# Patient Record
Sex: Female | Born: 1960 | Race: White | Hispanic: No | Smoking: Never smoker
Health system: Southern US, Community
[De-identification: ages and names within clinical notes are randomized; demographics above are authoritative.]

## PROBLEM LIST (undated history)

## (undated) DIAGNOSIS — M549 Dorsalgia, unspecified: Secondary | ICD-10-CM

## (undated) DIAGNOSIS — F1910 Other psychoactive substance abuse, uncomplicated: Secondary | ICD-10-CM

## (undated) DIAGNOSIS — M1990 Unspecified osteoarthritis, unspecified site: Secondary | ICD-10-CM

## (undated) DIAGNOSIS — F1011 Alcohol abuse, in remission: Secondary | ICD-10-CM

## (undated) DIAGNOSIS — K769 Liver disease, unspecified: Secondary | ICD-10-CM

## (undated) DIAGNOSIS — B1920 Unspecified viral hepatitis C without hepatic coma: Secondary | ICD-10-CM

## (undated) HISTORY — PX: ECTOPIC PREGNANCY SURGERY: SHX613

---

## 1998-06-13 ENCOUNTER — Other Ambulatory Visit: Admission: RE | Admit: 1998-06-13 | Discharge: 1998-06-13 | Payer: Self-pay | Admitting: Obstetrics & Gynecology

## 2005-12-04 ENCOUNTER — Ambulatory Visit (HOSPITAL_COMMUNITY): Admission: RE | Admit: 2005-12-04 | Discharge: 2005-12-04 | Payer: Self-pay | Admitting: Obstetrics & Gynecology

## 2009-11-07 ENCOUNTER — Ambulatory Visit: Payer: Self-pay | Admitting: Gastroenterology

## 2009-12-26 ENCOUNTER — Ambulatory Visit: Payer: Self-pay | Admitting: Gastroenterology

## 2010-04-07 ENCOUNTER — Other Ambulatory Visit: Payer: Self-pay | Admitting: Gastroenterology

## 2010-04-07 DIAGNOSIS — B182 Chronic viral hepatitis C: Secondary | ICD-10-CM

## 2010-04-18 ENCOUNTER — Other Ambulatory Visit (HOSPITAL_COMMUNITY): Payer: Self-pay

## 2010-04-23 ENCOUNTER — Ambulatory Visit (HOSPITAL_COMMUNITY)
Admission: RE | Admit: 2010-04-23 | Discharge: 2010-04-23 | Disposition: A | Discharge status: Home / Self Care | Payer: PRIVATE HEALTH INSURANCE | Source: Ambulatory Visit | Attending: Gastroenterology | Admitting: Gastroenterology

## 2010-04-23 ENCOUNTER — Other Ambulatory Visit: Payer: Self-pay | Admitting: Diagnostic Radiology

## 2010-04-23 DIAGNOSIS — B182 Chronic viral hepatitis C: Secondary | ICD-10-CM

## 2010-04-23 DIAGNOSIS — K7689 Other specified diseases of liver: Secondary | ICD-10-CM | POA: Insufficient documentation

## 2010-04-23 LAB — CBC
HCT: 41.2 % (ref 36.0–46.0)
MCHC: 34.2 g/dL (ref 30.0–36.0)
MCV: 87.1 fL (ref 78.0–100.0)
RDW: 12.3 % (ref 11.5–15.5)

## 2010-04-23 LAB — APTT: aPTT: 32 seconds (ref 24–37)

## 2010-05-08 ENCOUNTER — Ambulatory Visit (INDEPENDENT_AMBULATORY_CARE_PROVIDER_SITE_OTHER): Payer: PRIVATE HEALTH INSURANCE | Admitting: Gastroenterology

## 2010-05-08 DIAGNOSIS — B182 Chronic viral hepatitis C: Secondary | ICD-10-CM

## 2010-06-26 ENCOUNTER — Other Ambulatory Visit: Payer: PRIVATE HEALTH INSURANCE | Admitting: Gastroenterology

## 2011-07-28 IMAGING — US US BIOPSY
1 series · 9 of 9 positions shown · non-contrast
Comparison: none

Clinical: 49-year female with hepatitis C.

ULTRASOUND GUIDED CORE LIVER BIOPSY
Sedation:  1.5 mg IV Versed; 62.5 mcg IV Fentanyl
Total Moderate Sedation Time:  16 minutes.
An ultrasound guided liver biopsy was thoroughly discussed with the
patient and questions were answered.  The benefits, risks,
alternatives, and complications were also discussed.  The patient
understands and wishes to proceed with the procedure.  Written
consent was obtained.
Ultrasound of the liver was performed and an appropriate skin entry
site was determined.  Skin site was marked, prepped with Betadine,
and draped in the usual sterile fashion.  Local anesthesia was
provided with 1% Lidocaine.
A 17 gauge trocar needle was advanced under ultrasound guidance
into the liver.  A total of three coaxial 18 gauge core samples
were then obtained through the guide needle. The guide needle was
removed. Post procedure scans were obtained.
Complications:  None

[Series 1: us biopsy · 0.24mm/px · 9 of 9 slices shown]
[im 1/9]
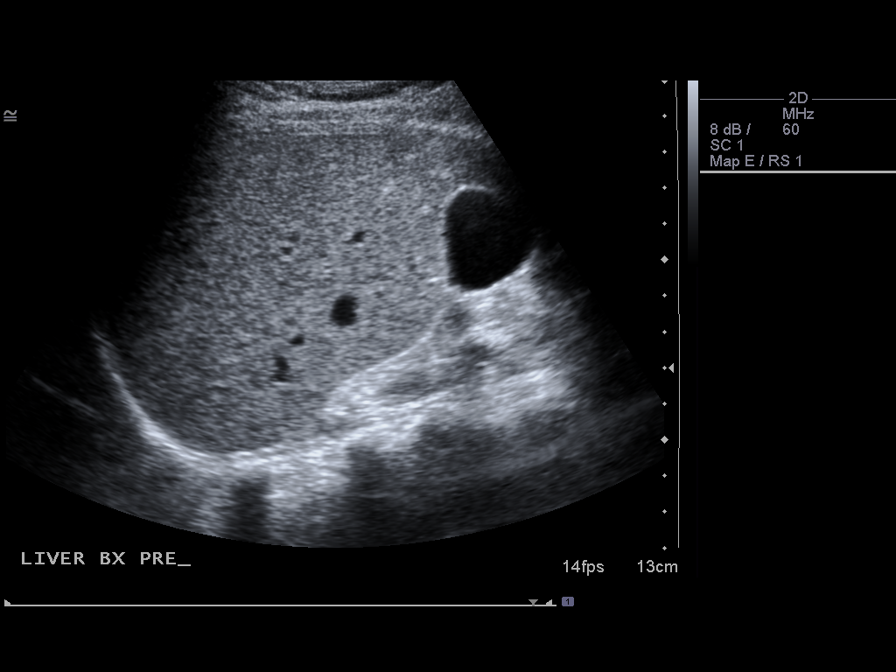
[im 2/9]
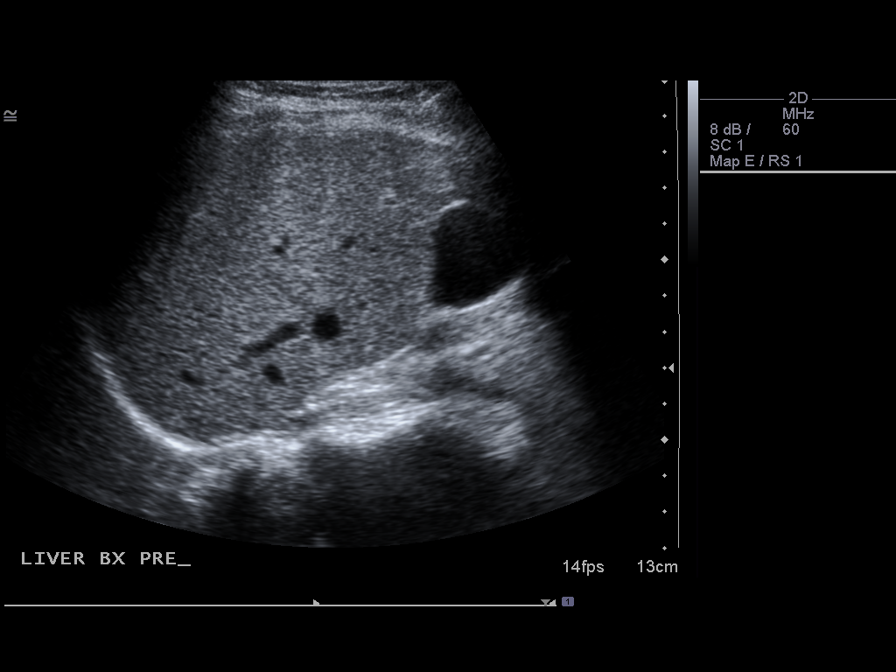
[im 3/9]
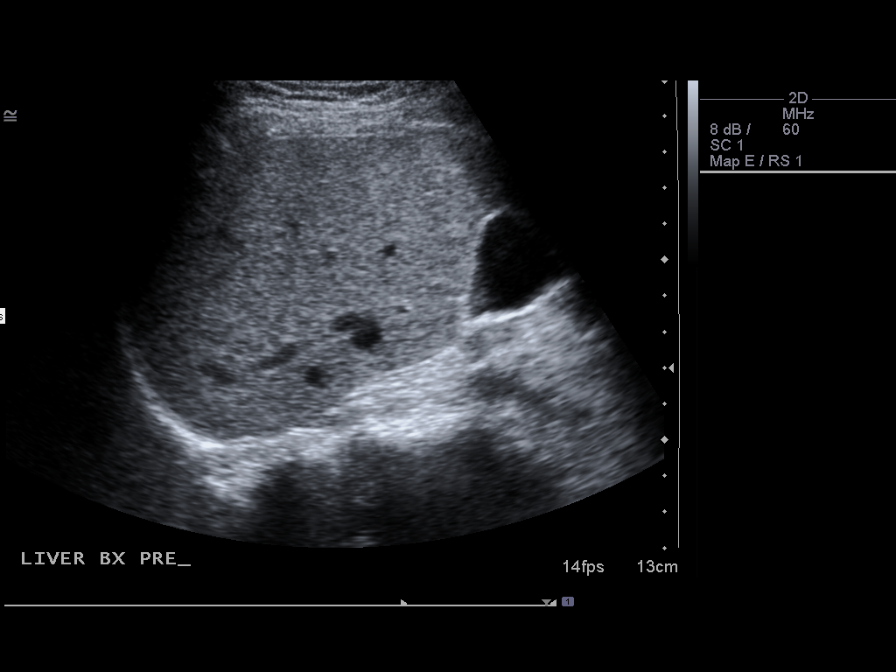
[im 4/9]
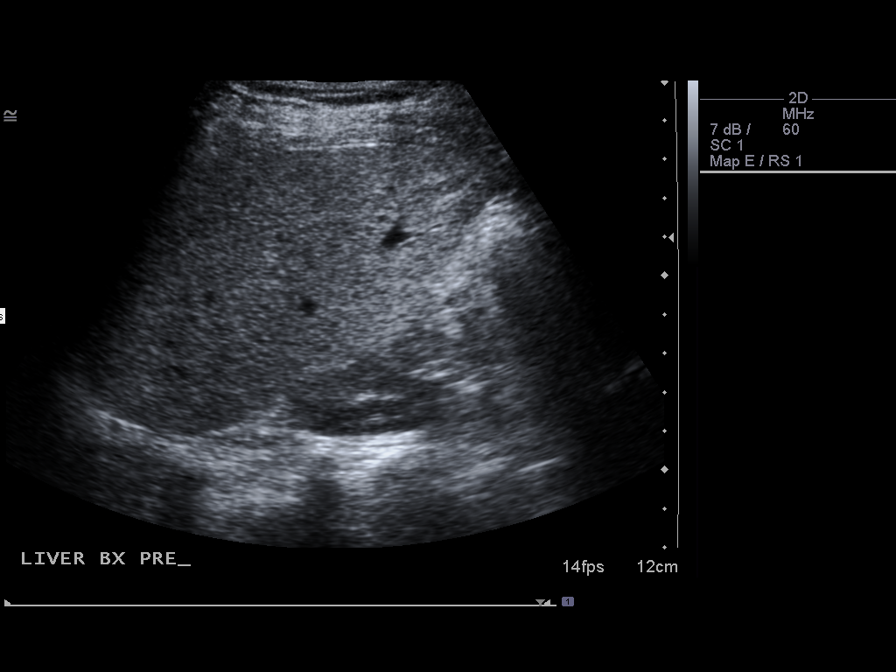
[im 5/9]
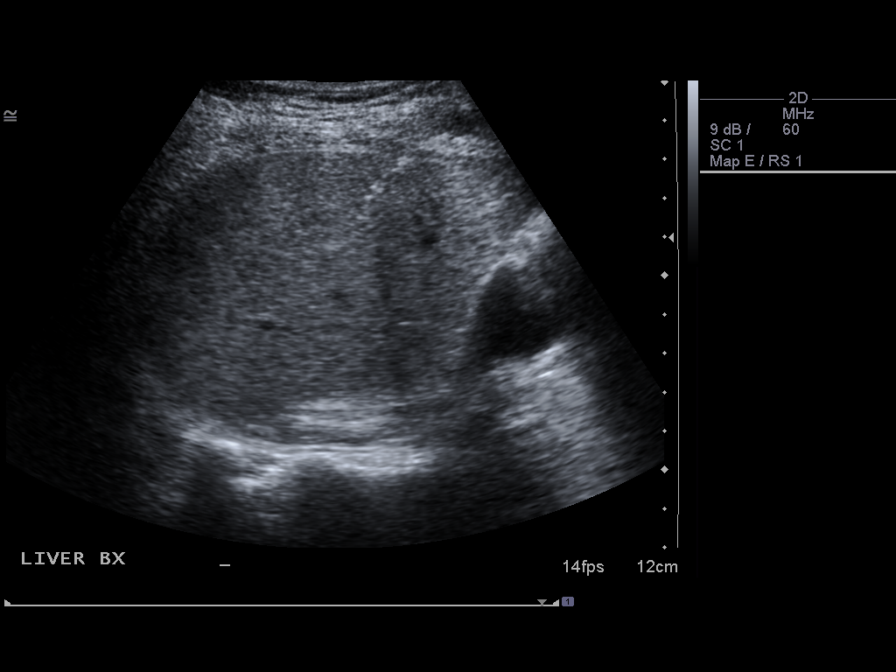
[im 6/9]
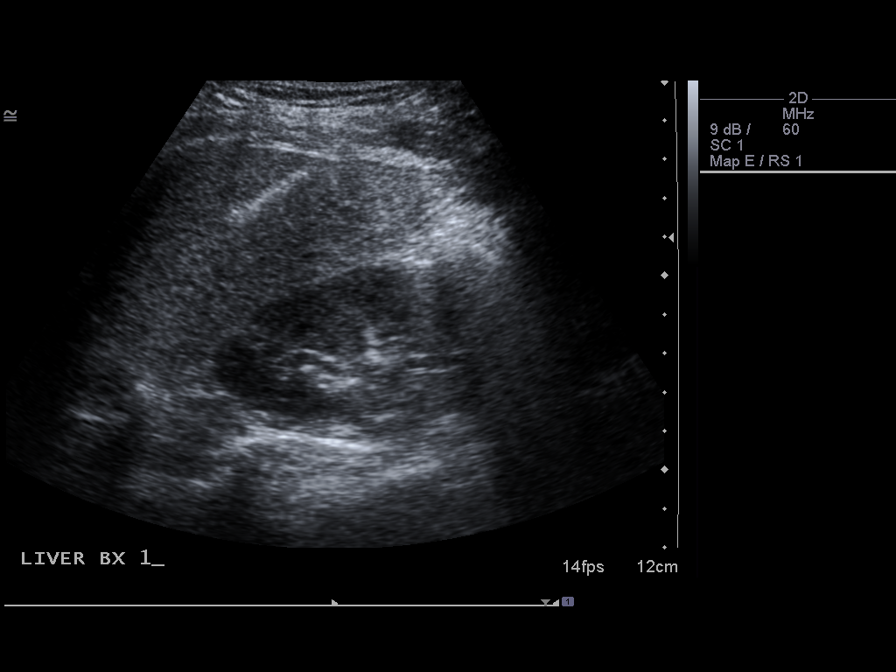
[im 7/9]
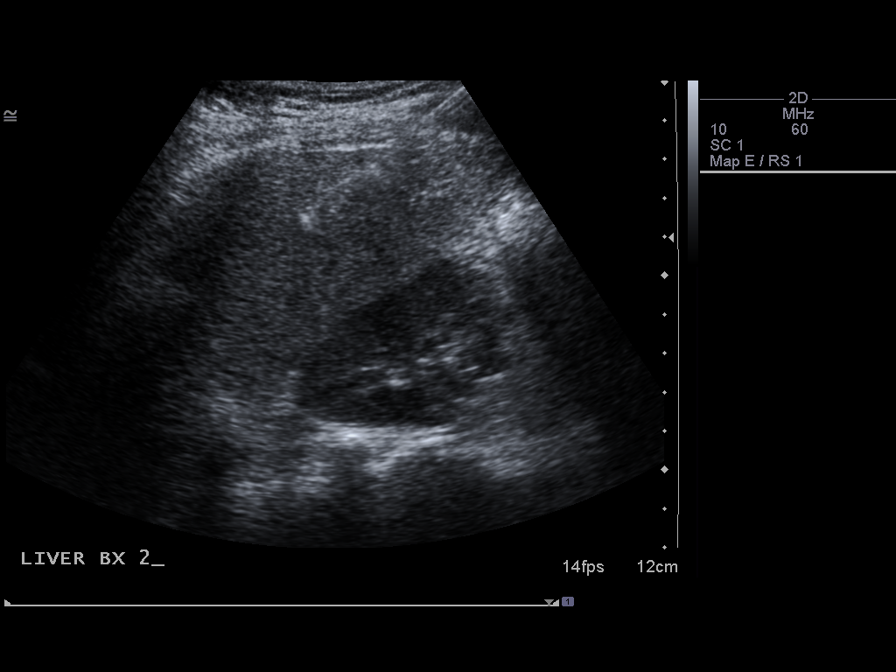
[im 8/9]
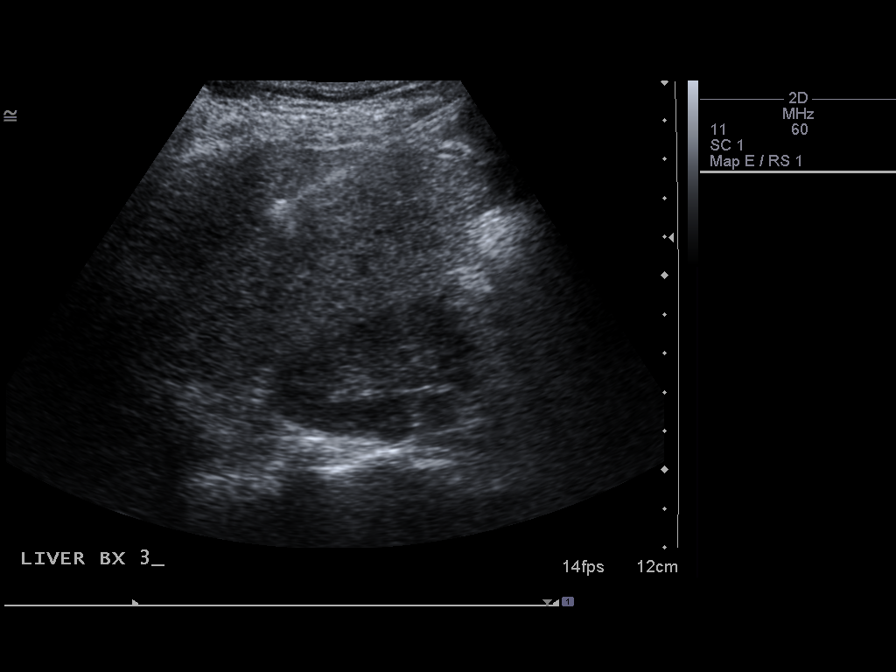
[im 9/9]
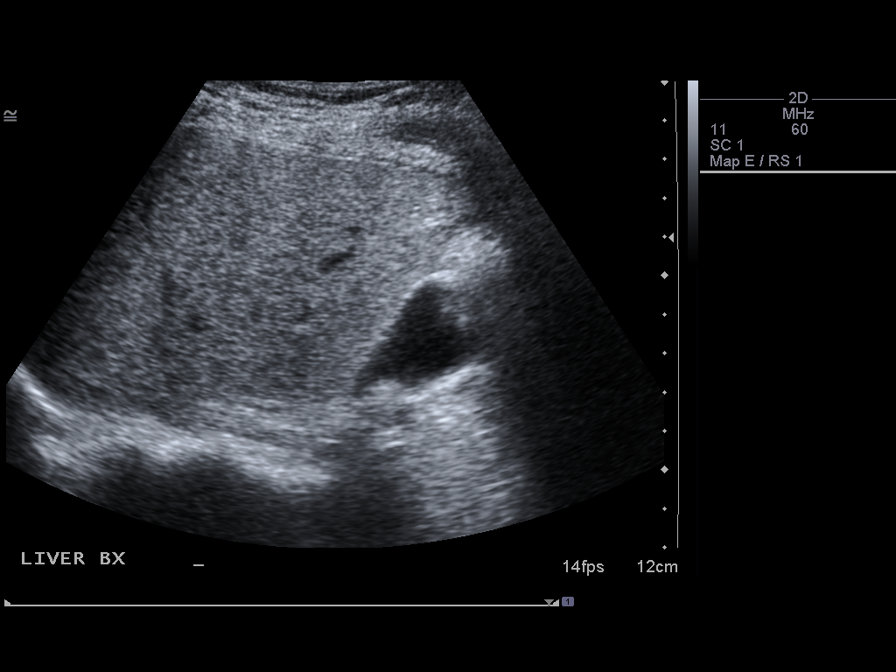

[9 of 9 positions shown; findings below may reference images not displayed]

FINDINGS: Core biopsies obtained from the right hepatic lobe.
IMPRESSION: Successful ultrasound guided random core biopsy of the liver.

## 2015-02-12 ENCOUNTER — Other Ambulatory Visit (HOSPITAL_COMMUNITY): Payer: Self-pay | Admitting: Gastroenterology

## 2015-02-12 DIAGNOSIS — B1920 Unspecified viral hepatitis C without hepatic coma: Secondary | ICD-10-CM

## 2015-02-28 ENCOUNTER — Ambulatory Visit (HOSPITAL_COMMUNITY)
Admission: RE | Admit: 2015-02-28 | Discharge: 2015-02-28 | Disposition: A | Discharge status: Home / Self Care | Payer: PRIVATE HEALTH INSURANCE | Source: Ambulatory Visit | Attending: Gastroenterology | Admitting: Gastroenterology

## 2015-02-28 DIAGNOSIS — B1920 Unspecified viral hepatitis C without hepatic coma: Secondary | ICD-10-CM

## 2016-02-03 IMAGING — US US ABDOMEN COMPLETE W/ ELASTOGRAPHY
2 series · 13 of 25 positions shown · non-contrast
Comparison: None.

CLINICAL DATA: Hepatitis C



[Series 1: us abdomen complete w/ elastography · 0.20mm/px · 2 of 13 slices shown (1 of 2)]
[im 1/13]
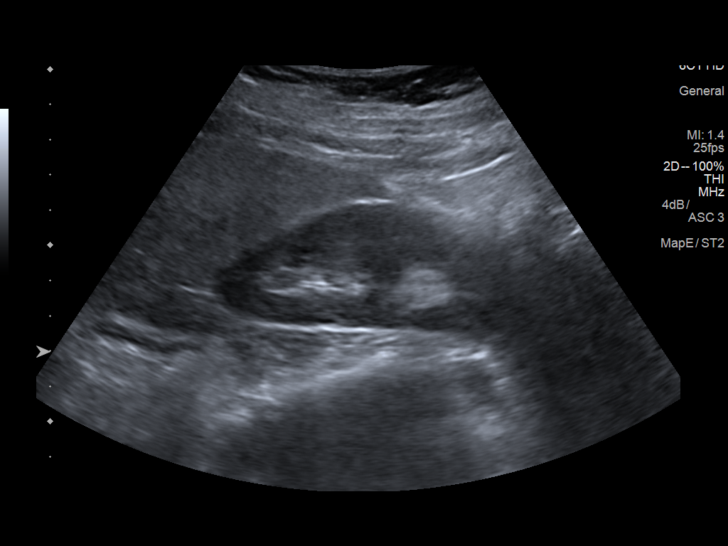
[im 9/13]
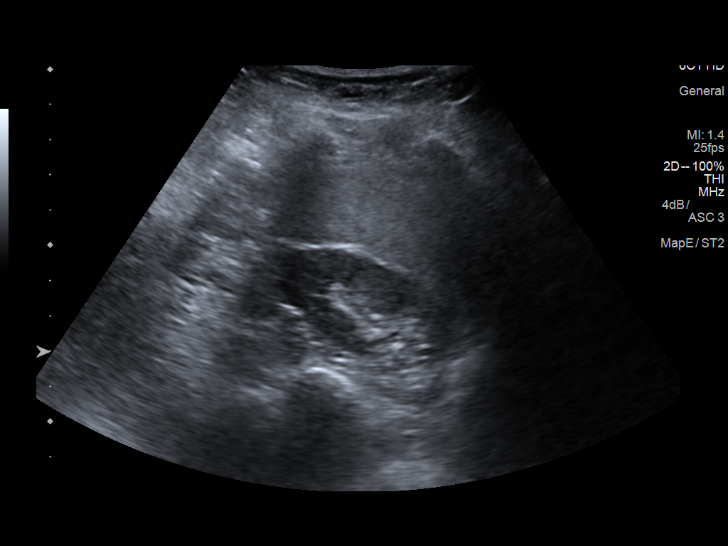

[Series 1: us abdomen complete w/ elastography · 0.22mm/px · 11 of 75 slices shown (2 of 2)]
[im 1/75]
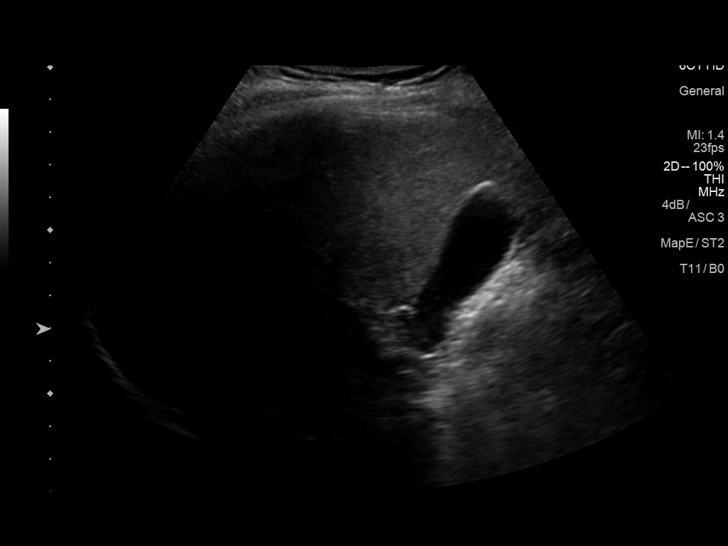
[im 8/75]
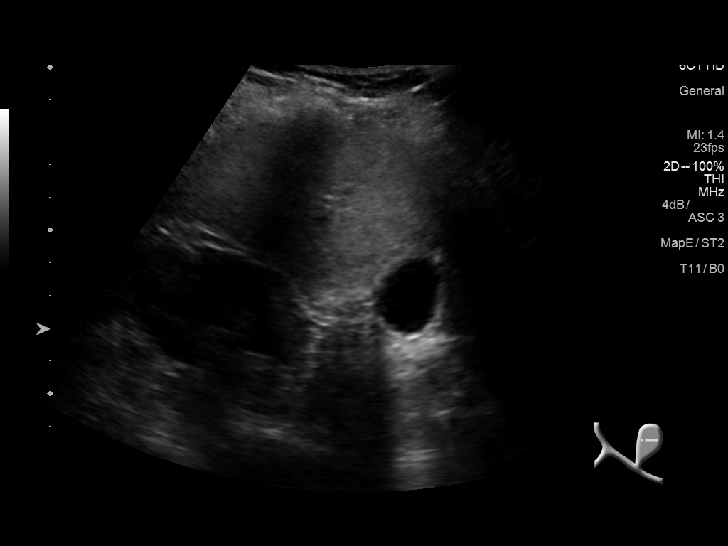
[im 15/75]
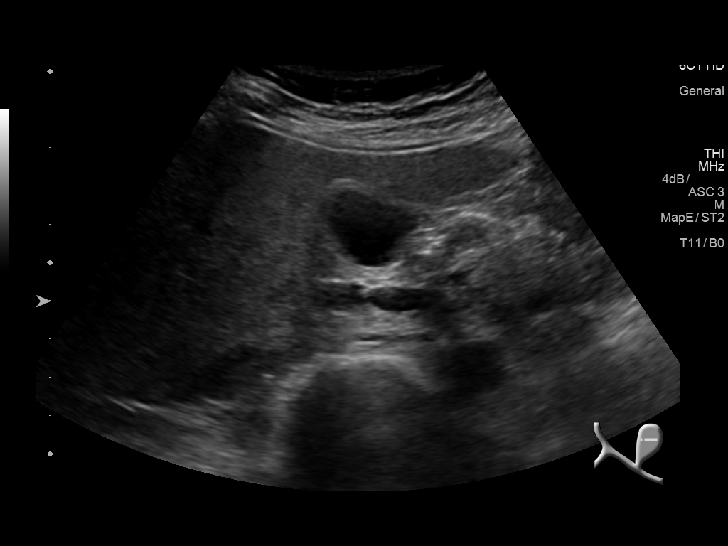
[im 23/75]
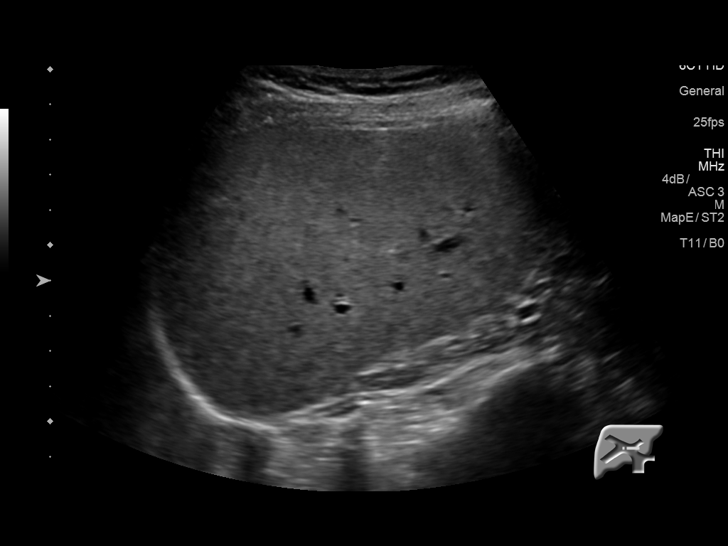
[im 30/75]
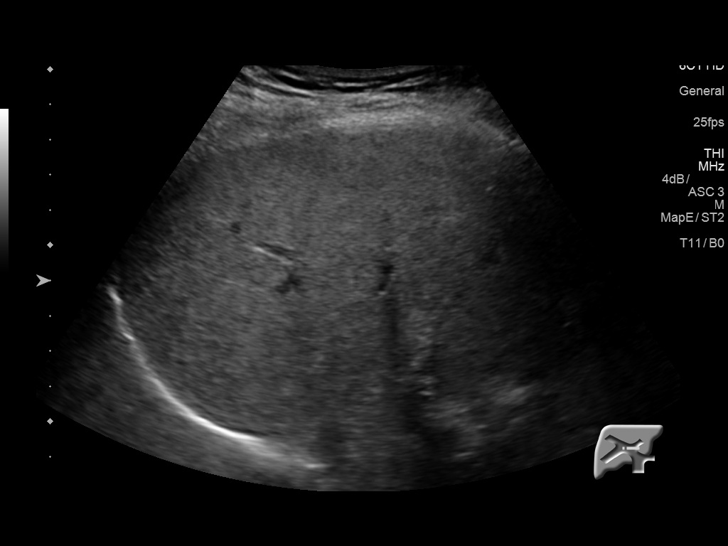
[im 38/75]
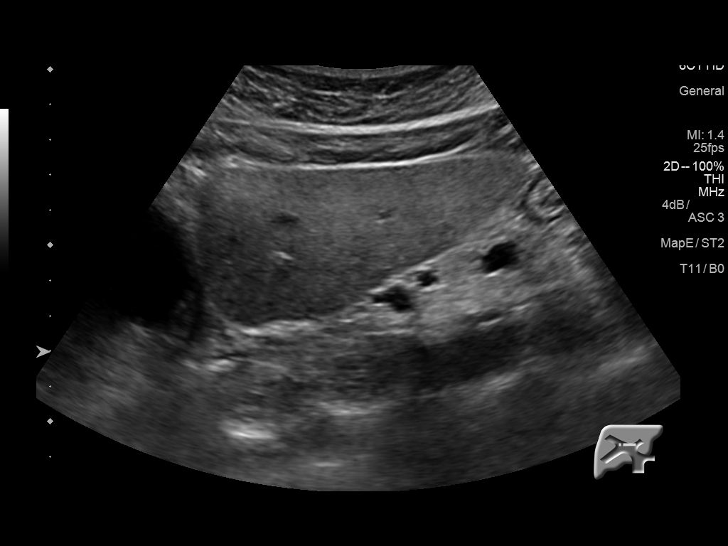
[im 45/75]
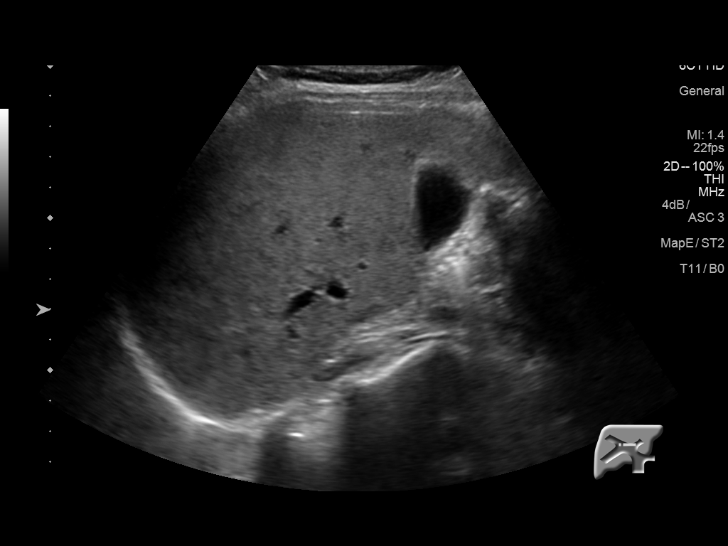
[im 52/75]
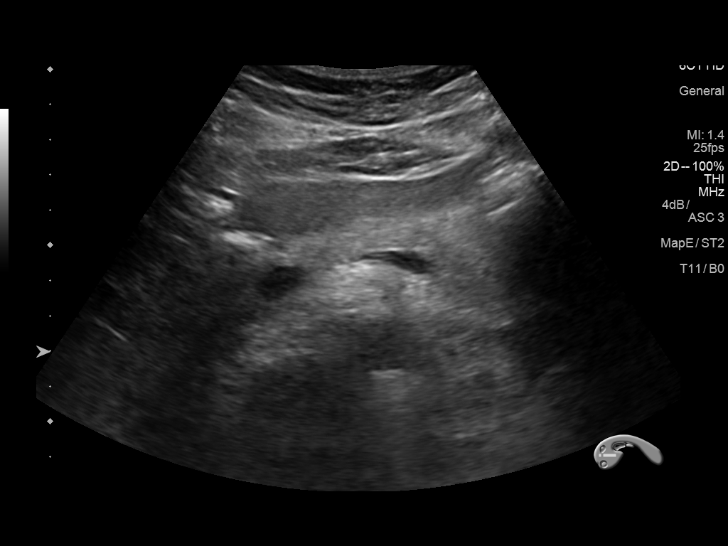
[im 60/75]
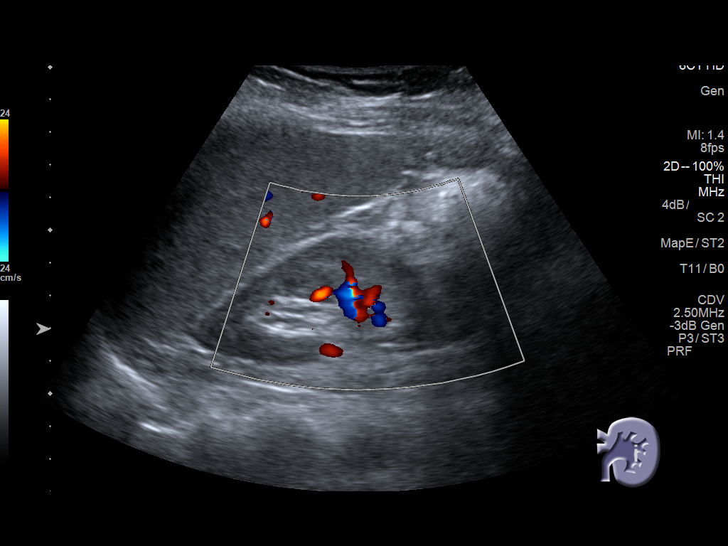
[im 67/75]
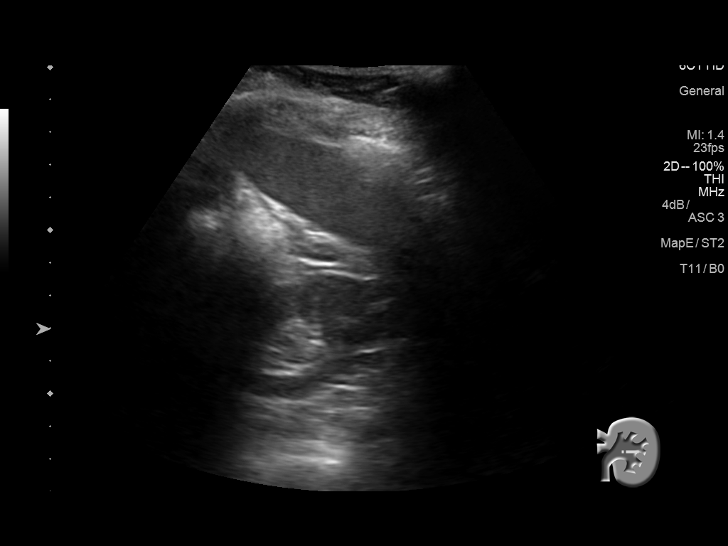
[im 75/75]
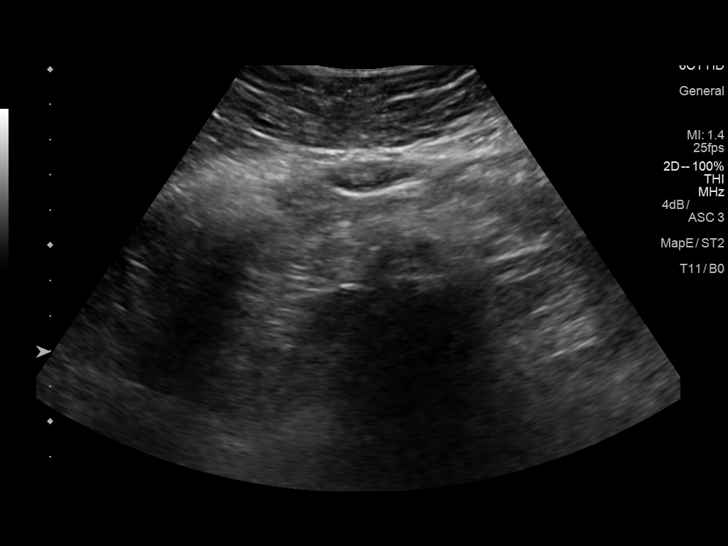

[13 of 25 positions shown; findings below may reference images not displayed]

FINDINGS: ULTRASOUND ABDOMEN

Gallbladder: No gallstones, gallbladder wall thickening, or
pericholecystic fluid. Negative sonographic Murphy's sign.

Common bile duct: Diameter: 2 mm is

Liver: No focal lesion identified. Within normal limits in
parenchymal echogenicity.

IVC: No abnormality visualized.

Pancreas: Visualized portion unremarkable.

Spleen: Size and appearance within normal limits.

Right Kidney: Length: 9.1 cm. No mass or hydronephrosis.

Left Kidney: Length: 9.4 cm. No mass or hydronephrosis.

Abdominal aorta: No aneurysm visualized.

Other findings: None.

ULTRASOUND HEPATIC ELASTOGRAPHY

Device: Siemens Helix VTQ

Patient position: Left lateral decubitus

Transducer 6C1

Number of measurements:  10

Hepatic Segment:  8

Median velocity:   1.24  m/sec

IQR:

IQR/Median velocity ratio

Corresponding Metavir fibrosis score:  F2/F3

Risk of fibrosis: Moderate

Limitations of exam: None

Pertinent findings noted on other imaging exams:  None

Please note that abnormal shear wave velocities may also be
identified in clinical settings other than with hepatic fibrosis,
such as: acute hepatitis, elevated right heart and central venous
pressures including use of beta blockers, Jorbenadze disease
(Rennemo), infiltrative processes such as
mastocytosis/amyloidosis/infiltrative tumor, extrahepatic
cholestasis, in the post-prandial state, and liver transplantation.
Correlation with patient history, laboratory data, and clinical
condition recommended.
IMPRESSION: Unremarkable abdominal ultrasound.

Median hepatic shear wave velocity is calculated at 1.24 m/sec.

Corresponding Metavir fibrosis score is F2/F3.

Risk of fibrosis is moderate.

Follow-up:  Additional testing appropriate.

## 2016-02-03 IMAGING — US US ABDOMEN COMPLETE W/ ELASTOGRAPHY
1 series · 12 of 12 positions shown · non-contrast
Comparison: None.

CLINICAL DATA: Hepatitis C



[Series 1: us abdomen complete w/ elastography · 0.15mm/px · 12 of 12 slices shown]
[im 1/12]
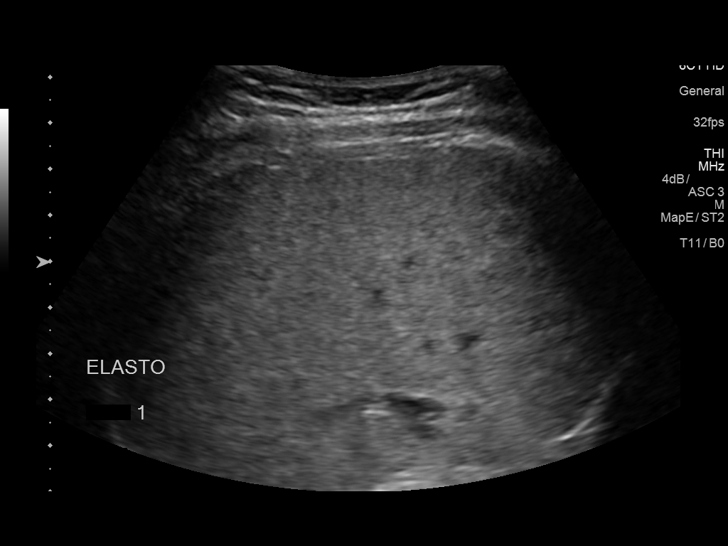
[im 2/12]
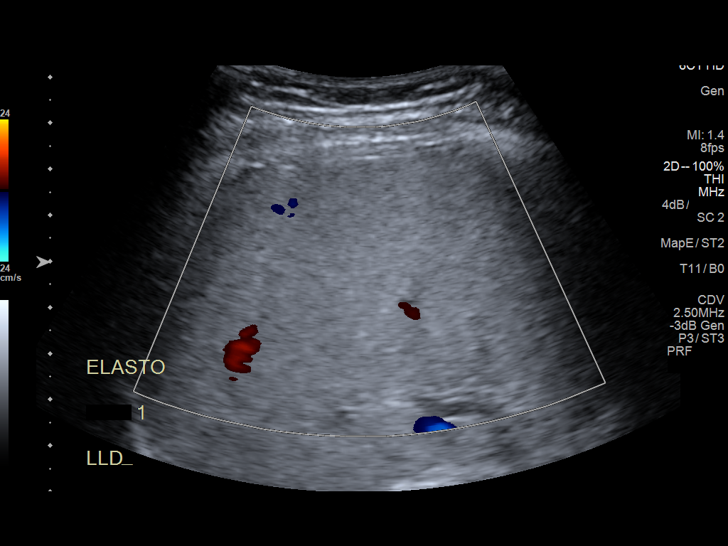
[im 3/12]
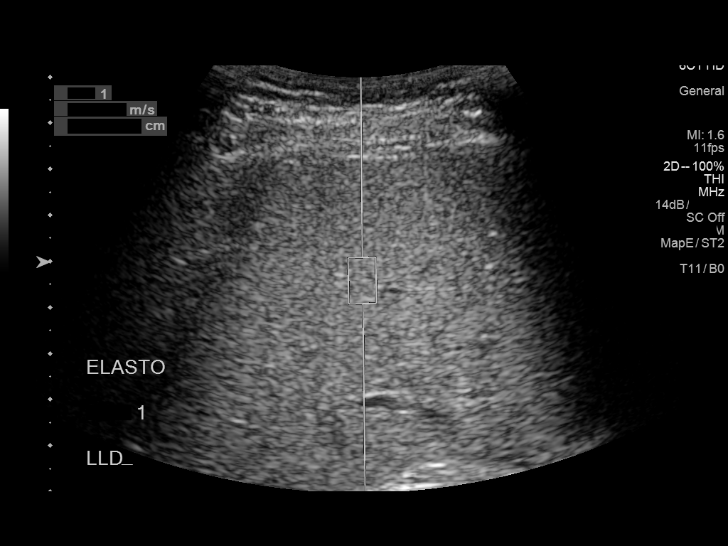
[im 4/12]
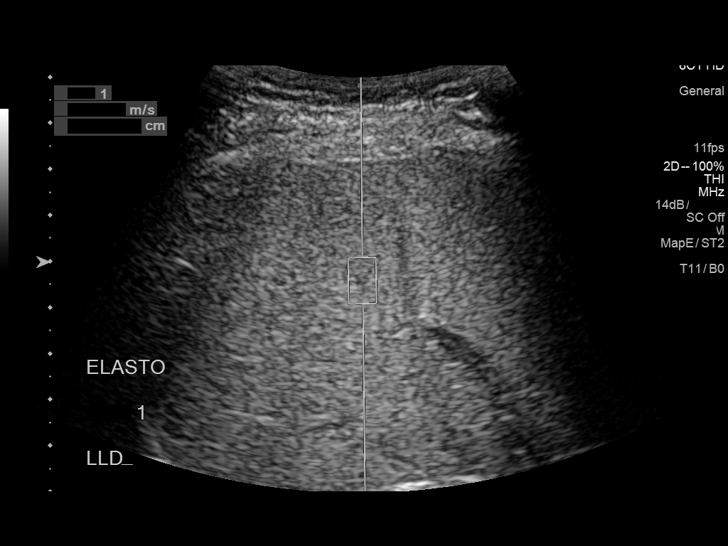
[im 5/12]
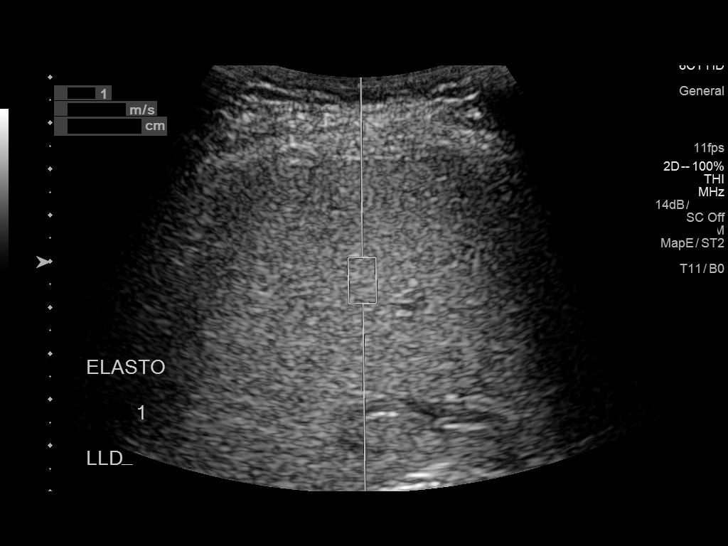
[im 6/12]
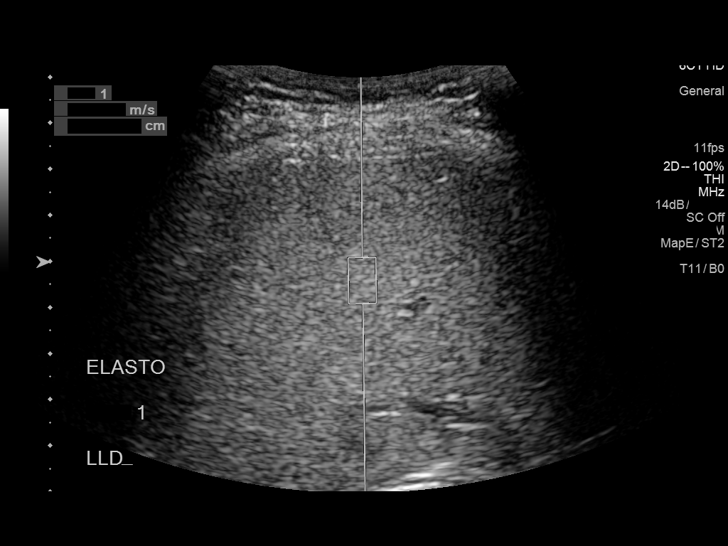
[im 7/12]
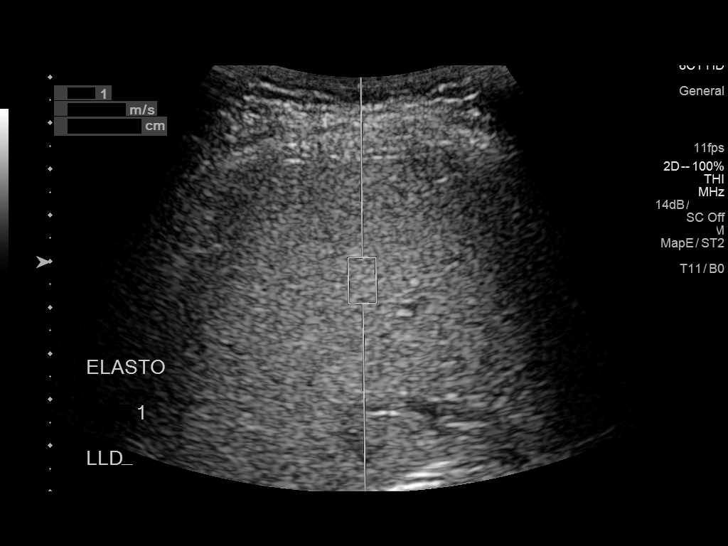
[im 8/12]
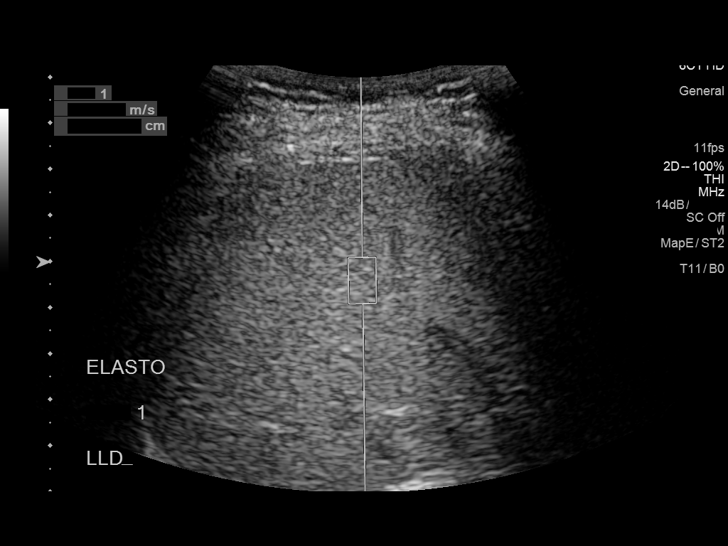
[im 9/12]
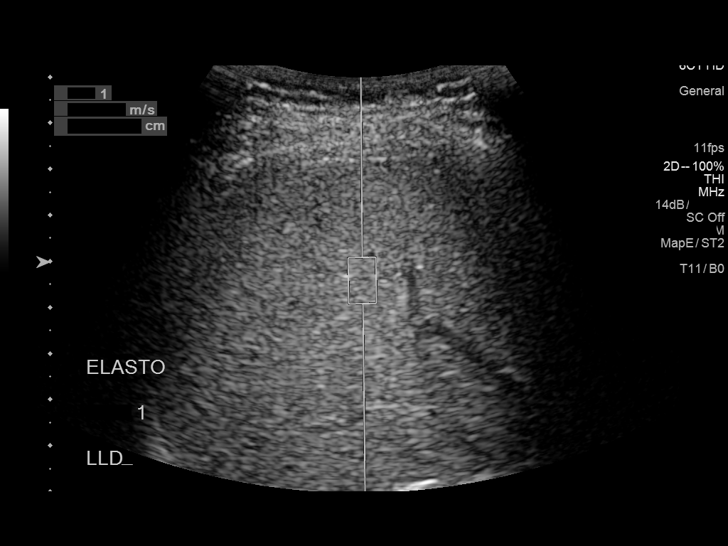
[im 10/12]
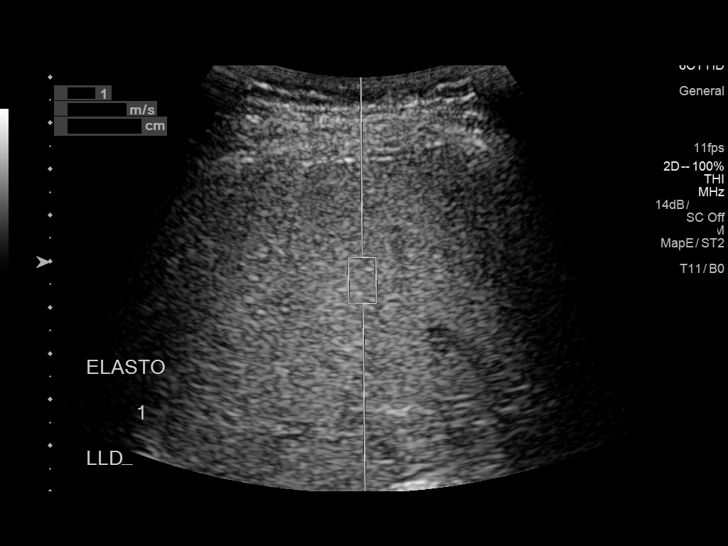
[im 11/12]
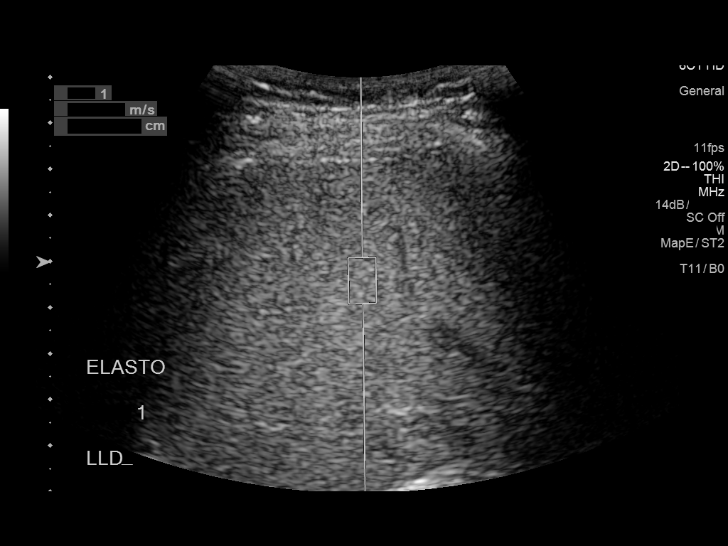
[im 12/12]
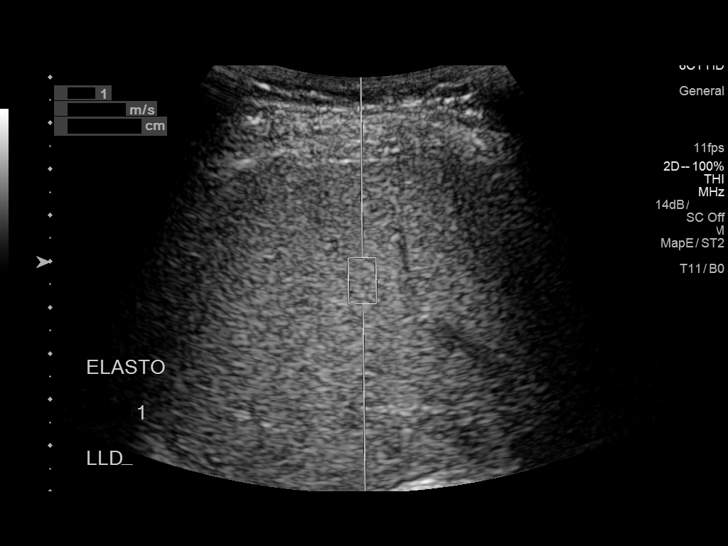

[12 of 12 positions shown; findings below may reference images not displayed]

FINDINGS: ULTRASOUND ABDOMEN

Gallbladder: No gallstones, gallbladder wall thickening, or
pericholecystic fluid. Negative sonographic Murphy's sign.

Common bile duct: Diameter: 2 mm is

Liver: No focal lesion identified. Within normal limits in
parenchymal echogenicity.

IVC: No abnormality visualized.

Pancreas: Visualized portion unremarkable.

Spleen: Size and appearance within normal limits.

Right Kidney: Length: 9.1 cm. No mass or hydronephrosis.

Left Kidney: Length: 9.4 cm. No mass or hydronephrosis.

Abdominal aorta: No aneurysm visualized.

Other findings: None.

ULTRASOUND HEPATIC ELASTOGRAPHY

Device: Siemens Helix VTQ

Patient position: Left lateral decubitus

Transducer 6C1

Number of measurements:  10

Hepatic Segment:  8

Median velocity:   1.24  m/sec

IQR:

IQR/Median velocity ratio

Corresponding Metavir fibrosis score:  F2/F3

Risk of fibrosis: Moderate

Limitations of exam: None

Pertinent findings noted on other imaging exams:  None

Please note that abnormal shear wave velocities may also be
identified in clinical settings other than with hepatic fibrosis,
such as: acute hepatitis, elevated right heart and central venous
pressures including use of beta blockers, Jorbenadze disease
(Rennemo), infiltrative processes such as
mastocytosis/amyloidosis/infiltrative tumor, extrahepatic
cholestasis, in the post-prandial state, and liver transplantation.
Correlation with patient history, laboratory data, and clinical
condition recommended.
IMPRESSION: Unremarkable abdominal ultrasound.

Median hepatic shear wave velocity is calculated at 1.24 m/sec.

Corresponding Metavir fibrosis score is F2/F3.

Risk of fibrosis is moderate.

Follow-up:  Additional testing appropriate.

## 2019-07-13 ENCOUNTER — Emergency Department (HOSPITAL_BASED_OUTPATIENT_CLINIC_OR_DEPARTMENT_OTHER)
Admission: EM | Admit: 2019-07-13 | Discharge: 2019-07-13 | Disposition: A | Discharge status: Home / Self Care | Payer: Medicare Other | Attending: Emergency Medicine | Admitting: Emergency Medicine

## 2019-07-13 ENCOUNTER — Encounter (HOSPITAL_BASED_OUTPATIENT_CLINIC_OR_DEPARTMENT_OTHER): Payer: Self-pay

## 2019-07-13 ENCOUNTER — Other Ambulatory Visit: Payer: Self-pay

## 2019-07-13 DIAGNOSIS — M25512 Pain in left shoulder: Secondary | ICD-10-CM | POA: Diagnosis not present

## 2019-07-13 DIAGNOSIS — M542 Cervicalgia: Secondary | ICD-10-CM | POA: Diagnosis present

## 2019-07-13 MED ORDER — PREDNISONE 50 MG PO TABS
60.0000 mg | ORAL_TABLET | Freq: Once | ORAL | Status: AC
  Administered 2019-07-13: 60 mg via ORAL
  Filled 2019-07-13: qty 1

## 2019-07-13 MED ORDER — OXYCODONE HCL 5 MG PO TABS
5.0000 mg | ORAL_TABLET | Freq: Once | ORAL | Status: AC
  Administered 2019-07-13: 5 mg via ORAL
  Filled 2019-07-13: qty 1

## 2019-07-13 MED ORDER — LIDOCAINE 5 % EX PTCH: 1.0000 | MEDICATED_PATCH | CUTANEOUS | 0 refills | Status: DC

## 2019-07-13 MED ORDER — KETOROLAC TROMETHAMINE 60 MG/2ML IM SOLN
60.0000 mg | Freq: Once | INTRAMUSCULAR | Status: AC
  Administered 2019-07-13: 60 mg via INTRAMUSCULAR
  Filled 2019-07-13: qty 2

## 2019-07-13 MED ORDER — METHYLPREDNISOLONE 4 MG PO TBPK: ORAL_TABLET | ORAL | 0 refills | Status: DC

## 2019-07-13 MED ORDER — DIAZEPAM 5 MG PO TABS: 5.0000 mg | ORAL_TABLET | Freq: Two times a day (BID) | ORAL | 0 refills | Status: DC

## 2019-07-13 MED FILL — diazePAM 5 MG TABS: 5 | 6 days supply | Qty: 12 | Fill #0

## 2019-07-13 MED FILL — LIDOCAINE PATCH 5%: 5 | 30 days supply | Qty: 30 | Fill #0

## 2019-07-13 MED FILL — METHYLPREDNISOLONE 4 MG TBP: 4 | 6 days supply | Qty: 21 | Fill #0

## 2019-07-13 NOTE — ED Notes (Signed)
Placed fall risk band on pt

## 2019-07-13 NOTE — ED Triage Notes (Addendum)
Pt reports left shoulder pain since Saturday.  Hx frozen shoulder in past.  Seen on Tuesday for same, prescribed baclofen and tramadol, last taken 4 hours ago.  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

## 2019-07-13 NOTE — ED Provider Notes (Signed)
[facility] EMERGENCY DEPARTMENT Provider Note   CSN: [id number] Arrival date & time: 07/13/19  0734     History Chief Complaint  Patient presents with  . Shoulder Pain    Deborah Long is a 59 y.o. female.  HPI     Neck and shoulder pain started Saturday, constant, getting worse Severe pain 10/10 pain, tramadol and baclofen Used to be IVDU so doesn't think tramadol helping  Reports has not used it since many years ago No relief with baclofen or tramadol Can't take anything with aspirin because of liver disease per pt  Hx of inflammatory arthritis and used to be on enbrel but was discontinued due to site reaction and no improvement  Used to arthritis pain but but this is a lot worse  Constant pain left trapeziu, left side of neck and shoulder, no radiation down arm.   No fever No falls or trauma No trouble controlling bowels, bladder or weakness/numbness    History reviewed. No pertinent past medical history.  There are no problems to display for this patient.     OB History   No obstetric history on file.     History reviewed. No pertinent family history.  Social History   Tobacco Use  . Smoking status: Not on file  Substance Use Topics  . Alcohol use: Not on file  . Drug use: Not on file    Home Medications Prior to Admission medications   Medication Sig Start Date End Date Taking? Authorizing Provider  baclofen (LIORESAL) 10 MG tablet Take 10 mg by mouth 3 (three) times daily.   Yes [provider]  sertraline (ZOLOFT) 50 MG tablet Take 50 mg by mouth daily.   Yes [provider]  traMADol (ULTRAM) 50 MG tablet Take by mouth.   Yes [provider]  diazepam (VALIUM) 5 MG tablet Take 1 tablet (5 mg total) by mouth 2 (two) times daily. 07/13/19   Gareth Morgan, MD  lidocaine (LIDODERM) 5 % Place 1 patch onto the skin daily. Remove & Discard patch within 12 hours or as directed by MD 07/13/19   Gareth Morgan,  MD  methylPREDNISolone (MEDROL DOSEPAK) 4 MG TBPK tablet Follow instructions on pack 07/13/19   Gareth Morgan, MD    Allergies    Patient has no known allergies.  Review of Systems   Review of Systems  Constitutional: Negative for fever.  Respiratory: Negative for cough and shortness of breath.   Cardiovascular: Negative for chest pain.  Gastrointestinal: Negative for nausea and vomiting.  Musculoskeletal: Positive for arthralgias.  Skin: Negative for rash.  Neurological: Negative for headaches.    Physical Exam Updated Vital Signs BP (!) 145/77 (BP Location: Right Arm)   Pulse 96   Temp 97.9 F (36.6 C) (Oral)   Resp 18   Ht 5' (1.524 m)   Wt 56.6 kg   SpO2 98%   BMI 24.35 kg/m   Physical Exam Vitals and nursing note reviewed.  Constitutional:      General: She is in acute distress (pain).     Appearance: Normal appearance. She is not ill-appearing, toxic-appearing or diaphoretic.  HENT:     Head: Normocephalic.  Eyes:     Conjunctiva/sclera: Conjunctivae normal.  Cardiovascular:     Rate and Rhythm: Normal rate and regular rhythm.     Pulses: Normal pulses.  Pulmonary:     Effort: Pulmonary effort is normal. No respiratory distress.  Musculoskeletal:        General:  Tenderness (left trapezius, paracervical) present. No deformity or signs of injury.     Cervical back: No rigidity.     Comments: Full ROM of shoulder, no erythema Normal strength and sensation, including normal finger abduction/wrist extension on left (reports pain on right wrist chronic), reports pain with movements or shoulder but with good ROM, no significant limitation with passive ROM  Skin:    General: Skin is warm and dry.     Coloration: Skin is not jaundiced or pale.  Neurological:     General: No focal deficit present.     Mental Status: She is alert and oriented to person, place, and time.     ED Results / Procedures / Treatments   Labs (all labs ordered are listed, but only  abnormal results are displayed) Labs Reviewed - No data to display  EKG None  Radiology No results found.  Procedures Procedures (including critical care time)  Medications Ordered in ED Medications  ketorolac (TORADOL) injection 60 mg (60 mg Intramuscular Given 07/13/19 1001)  predniSONE (DELTASONE) tablet 60 mg (60 mg Oral Given 07/13/19 0959)  oxyCODONE (Oxy IR/ROXICODONE) immediate release tablet 5 mg (5 mg Oral Given 07/13/19 1000)    ED Course  I have reviewed the triage vital signs and the nursing notes.  Pertinent labs & imaging results that were available during my care of the patient were reviewed by me and considered in my medical decision making (see chart for details).    MDM Rules/Calculators/A&P                          59yo female with hx of prior IVDU, hepatitis C, current inflammatory arthritis presents with concern for left neck/ shoulder pain.  Doubt this represents referred pain from chest or abdomen given significant tenderness to the area. No sign of DVT or acute arterial thrombus. No sign of cellulitis, good ROM of shoulder and doubt septic arthritis. Reports remote hx of IVDU and has not used for 20 years, no fevers, low suspicion at this time for epidural abscess/discitis/osteomyelitis.  Possible muscular pain or pain secondary to ruptured cervical disc or inflammatory arthritis. No sign of surgical spinal emergency.  Has tried baclofen and tramadol without relief.  Given rx for valium for muscular spasm, lidocaine patch, and medrol dose pack and recommend PCP follow up, discussed reasons to return.    Final Clinical Impression(s) / ED Diagnoses Final diagnoses:  Acute pain of left shoulder  Neck pain on left side    Rx / DC Orders ED Discharge Orders         Ordered    diazepam (VALIUM) 5 MG tablet  2 times daily     Discontinue  Reprint     07/13/19 0957    methylPREDNISolone (MEDROL DOSEPAK) 4 MG TBPK tablet     Discontinue  Reprint     07/13/19  0957    lidocaine (LIDODERM) 5 %  Every 24 hours     Discontinue  Reprint     07/13/19 0957           Alvira Monday, MD 07/13/19 2251

## 2020-01-11 ENCOUNTER — Emergency Department (HOSPITAL_BASED_OUTPATIENT_CLINIC_OR_DEPARTMENT_OTHER)
Admission: EM | Admit: 2020-01-11 | Discharge: 2020-01-11 | Disposition: A | Discharge status: Home / Self Care | Payer: Medicare Other | Attending: Emergency Medicine | Admitting: Emergency Medicine

## 2020-01-11 ENCOUNTER — Other Ambulatory Visit: Payer: Self-pay

## 2020-01-11 ENCOUNTER — Encounter (HOSPITAL_BASED_OUTPATIENT_CLINIC_OR_DEPARTMENT_OTHER): Payer: Self-pay

## 2020-01-11 ENCOUNTER — Other Ambulatory Visit (HOSPITAL_BASED_OUTPATIENT_CLINIC_OR_DEPARTMENT_OTHER): Payer: Self-pay | Admitting: Emergency Medicine

## 2020-01-11 DIAGNOSIS — M5441 Lumbago with sciatica, right side: Secondary | ICD-10-CM

## 2020-01-11 DIAGNOSIS — M545 Low back pain, unspecified: Secondary | ICD-10-CM | POA: Diagnosis present

## 2020-01-11 HISTORY — DX: Other psychoactive substance abuse, uncomplicated: F19.10

## 2020-01-11 HISTORY — DX: Unspecified osteoarthritis, unspecified site: M19.90

## 2020-01-11 HISTORY — DX: Dorsalgia, unspecified: M54.9

## 2020-01-11 HISTORY — DX: Unspecified viral hepatitis C without hepatic coma: B19.20

## 2020-01-11 HISTORY — DX: Liver disease, unspecified: K76.9

## 2020-01-11 HISTORY — DX: Alcohol abuse, in remission: F10.11

## 2020-01-11 MED ORDER — PREDNISONE 10 MG (21) PO TBPK: ORAL_TABLET | ORAL | 0 refills | Status: AC

## 2020-01-11 MED ORDER — HYDROCODONE-ACETAMINOPHEN 5-325 MG PO TABS: 1.0000 | ORAL_TABLET | Freq: Four times a day (QID) | ORAL | 0 refills | Status: DC | PRN

## 2020-01-11 MED ORDER — METHOCARBAMOL 500 MG PO TABS: 500.0000 mg | ORAL_TABLET | Freq: Two times a day (BID) | ORAL | 0 refills | Status: DC

## 2020-01-11 MED ORDER — LIDOCAINE 5 % EX PTCH: 1.0000 | MEDICATED_PATCH | CUTANEOUS | 0 refills | Status: DC

## 2020-01-11 MED FILL — predniSONE 10 MG TABS: 10 | 6 days supply | Qty: 21 | Fill #0

## 2020-01-11 MED FILL — LIDOCAINE PATCH 5%: 5 | 30 days supply | Qty: 30 | Fill #0

## 2020-01-11 MED FILL — METHOCARBAMOL 500 MG TABS: 500 | 10 days supply | Qty: 20 | Fill #0

## 2020-01-11 MED FILL — HYDROCODON-APAP 5-325: 5-325 | 2 days supply | Qty: 6 | Fill #0

## 2020-01-11 NOTE — ED Triage Notes (Signed)
Pt reports hx of chronic back pain "something wrong with my muscles"-worse x 2 weeks-to triage in w/c-grimacing

## 2020-01-11 NOTE — ED Provider Notes (Signed)
[facility] EMERGENCY DEPARTMENT Provider Note   CSN: [id number] Arrival date & time: 01/11/20  1134     History Chief Complaint  Patient presents with  . Back Pain    Deborah Long is a 59 y.o. female.  HPI     Deborah Long is a 59 y.o. female, with a history of arthritis, presenting to the ED with lower back pain beginning a few days ago.  Patient states she was raking leaves when she felt sudden onset of sharp pain initially in her right lower back.  This has since turned into a tightness and spasms across her lower back. Last IV drug use was 30 years ago. She has been taking ibuprofen without improvement. Denies fever/chills, abdominal pain, numbness, weakness, changes in bowel or bladder function, saddle anesthesias, or any other complaints.   Past Medical History:  Diagnosis Date  . Arthritis   . Back pain   . H/O ETOH abuse   . Hepatitis C   . IV drug abuse (HCC)   . Liver disease     There are no problems to display for this patient.   Past Surgical History:  Procedure Laterality Date  . ECTOPIC PREGNANCY SURGERY       OB History   No obstetric history on file.     No family history on file.  Social History   Tobacco Use  . Smoking status: Never Smoker  . Smokeless tobacco: Never Used  Vaping Use  . Vaping Use: Never used  Substance Use Topics  . Alcohol use: Not Currently  . Drug use: Not Currently    Home Medications Prior to Admission medications   Medication Sig Start Date End Date Taking? Authorizing Provider  HYDROcodone-acetaminophen (NORCO/VICODIN) 5-325 MG tablet Take 1 tablet by mouth every 6 (six) hours as needed for severe pain. 01/11/20   Mahli Glahn C, PA-C  lidocaine (LIDODERM) 5 % Place 1 patch onto the skin daily. Remove & Discard patch within 12 hours or as directed by MD 01/11/20   Keahi Mccarney C, PA-C  methocarbamol (ROBAXIN) 500 MG tablet Take 1 tablet (500 mg total) by mouth 2 (two) times daily. 01/11/20   Andreas Sobolewski,  Jacki Couse C, PA-C  predniSONE (STERAPRED UNI-PAK 21 TAB) 10 MG (21) TBPK tablet Take 6 tabs (60mg ) day 1, 5 tabs (50mg ) day 2, 4 tabs (40mg ) day 3, 3 tabs (30mg ) day 4, 2 tabs (20mg ) day 5, and 1 tab (10mg ) day 6. 01/11/20   Liesl Simons C, PA-C  sertraline (ZOLOFT) 50 MG tablet Take 50 mg by mouth daily.    [provider]    Allergies    Patient has no known allergies.  Review of Systems   Review of Systems  Constitutional: Negative for fever.  Cardiovascular: Negative for chest pain.  Gastrointestinal: Negative for abdominal pain, nausea and vomiting.  Genitourinary: Negative for difficulty urinating and dysuria.  Musculoskeletal: Positive for back pain.  Neurological: Negative for weakness and numbness.  All other systems reviewed and are negative.   Physical Exam Updated Vital Signs BP (!) 142/85 (BP Location: Left Arm)   Pulse (!) 109   Temp 98.3 F (36.8 C) (Oral)   Resp 20   Ht 5' (1.524 m)   Wt 54.4 kg   SpO2 100%   BMI 23.44 kg/m   Physical Exam Vitals and nursing note reviewed.  Constitutional:      General: She is not in acute distress.    Appearance: She is well-developed. She  is not diaphoretic.  HENT:     Head: Normocephalic and atraumatic.     Mouth/Throat:     Mouth: Mucous membranes are moist.     Pharynx: Oropharynx is clear.  Eyes:     Conjunctiva/sclera: Conjunctivae normal.  Cardiovascular:     Rate and Rhythm: Normal rate and regular rhythm.     Pulses: Normal pulses.          Radial pulses are 2+ on the right side and 2+ on the left side.       Posterior tibial pulses are 2+ on the right side and 2+ on the left side.     Comments: Tactile temperature in the extremities appropriate and equal bilaterally. Pulmonary:     Effort: Pulmonary effort is normal. No respiratory distress.  Abdominal:     Palpations: Abdomen is soft.     Tenderness: There is no abdominal tenderness. There is no guarding.  Musculoskeletal:     Cervical back: Neck  supple.     Right lower leg: No edema.     Left lower leg: No edema.     Comments: Tenderness to the bilateral lumbar musculature. Normal motor function intact in all extremities. No midline spinal tenderness.   Skin:    General: Skin is warm and dry.  Neurological:     Mental Status: She is alert.     Comments: Sensation grossly intact to light touch in the lower extremities bilaterally. No saddle anesthesias. Strength 5/5 in the bilateral lower extremities. No noted gait deficit. Coordination intact.  Psychiatric:        Mood and Affect: Mood and affect normal.        Speech: Speech normal.        Behavior: Behavior normal.     ED Results / Procedures / Treatments   Labs (all labs ordered are listed, but only abnormal results are displayed) Labs Reviewed - No data to display  EKG None  Radiology No results found.  Procedures Procedures (including critical care time)  Medications Ordered in ED Medications - No data to display  ED Course  I have reviewed the triage vital signs and the nursing notes.  Pertinent labs & imaging results that were available during my care of the patient were reviewed by me and considered in my medical decision making (see chart for details).    MDM Rules/Calculators/A&P                          Patient presents with lower back pain.  No focal neurologic deficits.  No findings that would give concern for neurosurgical emergency, such as cauda equina.  Since patient drove to the ED, we had limited options for pain management while she was here.  I do suspect her mild tachycardia was due to pain.  The patient was given instructions for home care as well as return precautions. Patient voices understanding of these instructions, accepts the plan, and is comfortable with discharge.   Final Clinical Impression(s) / ED Diagnoses Final diagnoses:  Acute bilateral low back pain with right-sided sciatica    Rx / DC Orders ED Discharge  Orders         Ordered    methocarbamol (ROBAXIN) 500 MG tablet  2 times daily        01/11/20 1423    lidocaine (LIDODERM) 5 %  Every 24 hours        01/11/20 1423    predniSONE (  STERAPRED UNI-PAK 21 TAB) 10 MG (21) TBPK tablet        01/11/20 1423    HYDROcodone-acetaminophen (NORCO/VICODIN) 5-325 MG tablet  Every 6 hours PRN        01/11/20 1423           Anselm Pancoast, PA-C 01/12/20 0013    Derwood Kaplan, MD 01/19/20 2250

## 2020-01-11 NOTE — Discharge Instructions (Signed)
  Expect your soreness to increase over the next 2-3 days. Take it easy, but do not lay around too much as this may make any stiffness worse.  Antiinflammatory medications: Take 600 mg of ibuprofen every 6 hours or 440 mg (over the counter dose) to 500 mg (prescription dose) of naproxen every 12 hours for the next 3 days. After this time, these medications may be used as needed for pain. Take these medications with food to avoid upset stomach. Choose only one of these medications, do not take them together. Acetaminophen (generic for Tylenol): Should you continue to have additional pain while taking the ibuprofen or naproxen, you may add in acetaminophen as needed. Your daily total maximum amount of acetaminophen from all sources should be limited to 4000mg /day for persons without liver problems, or 2000mg /day for those with liver problems. Vicodin: May take Vicodin (hydrocodone-acetaminophen) as needed for severe pain.   Do not drive or perform other dangerous activities while taking this medication as it can cause drowsiness as well as changes in reaction time and judgement.   Please note that each pill of Vicodin contains 325 mg of acetaminophen (generic for Tylenol) and the above dosage limits apply. Methocarbamol: Methocarbamol (generic for Robaxin) is a muscle relaxer and can help relieve stiff muscles or muscle spasms.  Do not drive or perform other dangerous activities while taking this medication as it can cause drowsiness as well as changes in reaction time and judgement. Lidocaine patches: These are available via either prescription or over-the-counter. The over-the-counter option may be more economical one and are likely just as effective. There are multiple over-the-counter brands, such as Salonpas. Ice: May apply ice to the area over the next 24 hours for 15 minutes at a time to reduce pain, inflammation, and swelling, if present. Exercises: Be sure to perform the attached exercises starting  with three times a week and working up to performing them daily. This is an essential part of preventing long term problems.  Follow up: Follow up with a primary care provider or orthopedic specialist for any future management of these complaints. Be sure to follow up within 7-10 days. Return: Return to the ED should symptoms worsen.  For prescription assistance, may try using prescription discount sites or apps, such as goodrx.com

## 2020-01-31 ENCOUNTER — Telehealth: Payer: Self-pay | Admitting: Family Medicine

## 2020-01-31 NOTE — Telephone Encounter (Signed)
Called pt to scheduled ED follow up appt for back pain w/ sciatica-- no answer, message left.  --glh

## 2020-05-21 ENCOUNTER — Other Ambulatory Visit (HOSPITAL_BASED_OUTPATIENT_CLINIC_OR_DEPARTMENT_OTHER): Payer: Self-pay

## 2022-01-15 ENCOUNTER — Other Ambulatory Visit: Payer: Self-pay

## 2022-01-15 ENCOUNTER — Encounter (HOSPITAL_COMMUNITY)
Admission: EM | Disposition: A | Discharge status: Home / Self Care | Payer: Self-pay | Source: Home / Self Care | Attending: Emergency Medicine

## 2022-01-15 ENCOUNTER — Emergency Department (HOSPITAL_BASED_OUTPATIENT_CLINIC_OR_DEPARTMENT_OTHER): Payer: Medicare Other

## 2022-01-15 ENCOUNTER — Encounter (HOSPITAL_BASED_OUTPATIENT_CLINIC_OR_DEPARTMENT_OTHER): Payer: Self-pay

## 2022-01-15 ENCOUNTER — Emergency Department (HOSPITAL_BASED_OUTPATIENT_CLINIC_OR_DEPARTMENT_OTHER)
Admission: EM | Admit: 2022-01-15 | Discharge: 2022-01-16 | Disposition: A | Discharge status: Home / Self Care | Payer: Medicare Other | Attending: Emergency Medicine | Admitting: Emergency Medicine

## 2022-01-15 DIAGNOSIS — Z79899 Other long term (current) drug therapy: Secondary | ICD-10-CM | POA: Insufficient documentation

## 2022-01-15 DIAGNOSIS — M1990 Unspecified osteoarthritis, unspecified site: Secondary | ICD-10-CM | POA: Diagnosis not present

## 2022-01-15 DIAGNOSIS — R509 Fever, unspecified: Secondary | ICD-10-CM | POA: Insufficient documentation

## 2022-01-15 DIAGNOSIS — X58XXXA Exposure to other specified factors, initial encounter: Secondary | ICD-10-CM | POA: Insufficient documentation

## 2022-01-15 DIAGNOSIS — S61210A Laceration without foreign body of right index finger without damage to nail, initial encounter: Secondary | ICD-10-CM | POA: Insufficient documentation

## 2022-01-15 DIAGNOSIS — R609 Edema, unspecified: Secondary | ICD-10-CM | POA: Diagnosis not present

## 2022-01-15 DIAGNOSIS — M659 Synovitis and tenosynovitis, unspecified: Secondary | ICD-10-CM | POA: Diagnosis not present

## 2022-01-15 DIAGNOSIS — R2231 Localized swelling, mass and lump, right upper limb: Secondary | ICD-10-CM | POA: Diagnosis present

## 2022-01-15 DIAGNOSIS — M65141 Other infective (teno)synovitis, right hand: Secondary | ICD-10-CM | POA: Diagnosis not present

## 2022-01-15 HISTORY — PX: I & D EXTREMITY: SHX5045

## 2022-01-15 LAB — LACTIC ACID, PLASMA: Lactic Acid, Venous: 0.9 mmol/L (ref 0.5–1.9)

## 2022-01-15 LAB — PROTIME-INR
INR: 1.2 (ref 0.8–1.2)
Prothrombin Time: 14.6 seconds (ref 11.4–15.2)

## 2022-01-15 LAB — URINALYSIS, MICROSCOPIC (REFLEX)

## 2022-01-15 LAB — HEPATIC FUNCTION PANEL
ALT: 12 U/L (ref 0–44)
AST: 19 U/L (ref 15–41)
Albumin: 4.8 g/dL (ref 3.5–5.0)
Alkaline Phosphatase: 94 U/L (ref 38–126)
Bilirubin, Direct: 0.2 mg/dL (ref 0.0–0.2)
Indirect Bilirubin: 0.8 mg/dL (ref 0.3–0.9)
Total Bilirubin: 1 mg/dL (ref 0.3–1.2)
Total Protein: 9.2 g/dL — ABNORMAL HIGH (ref 6.5–8.1)

## 2022-01-15 LAB — URINALYSIS, ROUTINE W REFLEX MICROSCOPIC
Bilirubin Urine: NEGATIVE
Glucose, UA: NEGATIVE mg/dL
Hgb urine dipstick: NEGATIVE
Ketones, ur: 40 mg/dL — AB
Nitrite: NEGATIVE
Protein, ur: 30 mg/dL — AB
Specific Gravity, Urine: 1.025 (ref 1.005–1.030)
pH: 5.5 (ref 5.0–8.0)

## 2022-01-15 LAB — CBC WITH DIFFERENTIAL/PLATELET
Abs Immature Granulocytes: 0.04 10*3/uL (ref 0.00–0.07)
Basophils Absolute: 0 10*3/uL (ref 0.0–0.1)
Basophils Relative: 0 %
Eosinophils Absolute: 0 10*3/uL (ref 0.0–0.5)
Eosinophils Relative: 0 %
HCT: 45.9 % (ref 36.0–46.0)
Hemoglobin: 15.5 g/dL — ABNORMAL HIGH (ref 12.0–15.0)
Immature Granulocytes: 0 %
Lymphocytes Relative: 24 %
Lymphs Abs: 3.2 10*3/uL (ref 0.7–4.0)
MCH: 29.2 pg (ref 26.0–34.0)
MCHC: 33.8 g/dL (ref 30.0–36.0)
MCV: 86.4 fL (ref 80.0–100.0)
Monocytes Absolute: 0.6 10*3/uL (ref 0.1–1.0)
Monocytes Relative: 4 %
Neutro Abs: 9.8 10*3/uL — ABNORMAL HIGH (ref 1.7–7.7)
Neutrophils Relative %: 72 %
Platelets: 208 10*3/uL (ref 150–400)
RBC: 5.31 MIL/uL — ABNORMAL HIGH (ref 3.87–5.11)
RDW: 12.3 % (ref 11.5–15.5)
WBC: 13.7 10*3/uL — ABNORMAL HIGH (ref 4.0–10.5)
nRBC: 0 % (ref 0.0–0.2)

## 2022-01-15 LAB — BASIC METABOLIC PANEL
Anion gap: 9 (ref 5–15)
BUN: 13 mg/dL (ref 8–23)
CO2: 25 mmol/L (ref 22–32)
Calcium: 9.8 mg/dL (ref 8.9–10.3)
Chloride: 103 mmol/L (ref 98–111)
Creatinine, Ser: 0.88 mg/dL (ref 0.44–1.00)
GFR, Estimated: >60 mL/min (ref >60)
Glucose, Bld: 99 mg/dL (ref 70–99)
Potassium: 3.5 mmol/L (ref 3.5–5.1)
Sodium: 137 mmol/L (ref 135–145)

## 2022-01-15 LAB — RAPID URINE DRUG SCREEN, HOSP PERFORMED
Amphetamines: NOT DETECTED
Barbiturates: NOT DETECTED
Benzodiazepines: NOT DETECTED
Cocaine: NOT DETECTED
Opiates: NOT DETECTED
Tetrahydrocannabinol: POSITIVE — AB

## 2022-01-15 LAB — SEDIMENTATION RATE: Sed Rate: 25 mm/hr — ABNORMAL HIGH (ref 0–22)

## 2022-01-15 LAB — ETHANOL: Alcohol, Ethyl (B): <10 mg/dL (ref <10)

## 2022-01-15 SURGERY — IRRIGATION AND DEBRIDEMENT EXTREMITY
Anesthesia: General | Site: Hand | Laterality: Right

## 2022-01-15 MED ORDER — VANCOMYCIN HCL 750 MG/150ML IV SOLN: 750.0000 mg | INTRAVENOUS | Status: DC

## 2022-01-15 MED ORDER — LACTATED RINGERS IV BOLUS
1000.0000 mL | Freq: Once | INTRAVENOUS | Status: AC
  Administered 2022-01-15: 1000 mL via INTRAVENOUS

## 2022-01-15 MED ORDER — HYDROMORPHONE HCL 1 MG/ML IJ SOLN
0.5000 mg | INTRAMUSCULAR | Status: DC | PRN
  Administered 2022-01-15: 0.5 mg via INTRAVENOUS
  Filled 2022-01-15: qty 1

## 2022-01-15 MED ORDER — SODIUM CHLORIDE 0.9 % IV SOLN
2.0000 g | Freq: Once | INTRAVENOUS | Status: AC
  Administered 2022-01-15: 2 g via INTRAVENOUS
  Filled 2022-01-15: qty 20

## 2022-01-15 MED ORDER — LACTATED RINGERS IV SOLN: INTRAVENOUS | Status: DC

## 2022-01-15 MED ORDER — VANCOMYCIN HCL IN DEXTROSE 1-5 GM/200ML-% IV SOLN
1000.0000 mg | Freq: Once | INTRAVENOUS | Status: AC
  Administered 2022-01-15: 1000 mg via INTRAVENOUS
  Filled 2022-01-15: qty 200

## 2022-01-15 MED ORDER — VANCOMYCIN HCL 750 MG/150ML IV SOLN
750.0000 mg | INTRAVENOUS | Status: DC
  Filled 2022-01-15: qty 150

## 2022-01-15 SURGICAL SUPPLY — 47 items
BAG COUNTER SPONGE SURGICOUNT (BAG) ×2 IMPLANT
BAG SPNG CNTER NS LX DISP (BAG) ×1
BNDG CMPR 9X4 STRL LF SNTH (GAUZE/BANDAGES/DRESSINGS) ×1
BNDG ELASTIC 3X5.8 VLCR STR LF (GAUZE/BANDAGES/DRESSINGS) IMPLANT
BNDG ELASTIC 4X5.8 VLCR STR LF (GAUZE/BANDAGES/DRESSINGS) ×2 IMPLANT
BNDG ESMARK 4X9 LF (GAUZE/BANDAGES/DRESSINGS) ×2 IMPLANT
BNDG GAUZE DERMACEA FLUFF 4 (GAUZE/BANDAGES/DRESSINGS) ×2 IMPLANT
BNDG GZE DERMACEA 4 6PLY (GAUZE/BANDAGES/DRESSINGS) ×1
CANNULA VESSEL 3MM 2 BLNT TIP (CANNULA) IMPLANT
CORD BIPOLAR FORCEPS 12FT (ELECTRODE) ×2 IMPLANT
CUFF TOURN SGL QUICK 18X4 (TOURNIQUET CUFF) ×2 IMPLANT
CUFF TOURN SGL QUICK 24 (TOURNIQUET CUFF)
CUFF TRNQT CYL 24X4X16.5-23 (TOURNIQUET CUFF) IMPLANT
DRSG ADAPTIC 3X8 NADH LF (GAUZE/BANDAGES/DRESSINGS) ×2 IMPLANT
DRSG EMULSION OIL 3X3 NADH (GAUZE/BANDAGES/DRESSINGS) ×2 IMPLANT
GAUZE PACKING IODOFORM 1/2INX (GAUZE/BANDAGES/DRESSINGS) IMPLANT
GAUZE PAD ABD 8X10 STRL (GAUZE/BANDAGES/DRESSINGS) ×4 IMPLANT
GAUZE SPONGE 4X4 12PLY STRL (GAUZE/BANDAGES/DRESSINGS) ×2 IMPLANT
GAUZE XEROFORM 1X8 LF (GAUZE/BANDAGES/DRESSINGS) ×2 IMPLANT
HANDPIECE INTERPULSE COAX TIP (DISPOSABLE)
KIT BASIN OR (CUSTOM PROCEDURE TRAY) ×2 IMPLANT
KIT TURNOVER KIT B (KITS) ×2 IMPLANT
MANIFOLD NEPTUNE II (INSTRUMENTS) ×2 IMPLANT
NDL HYPO 25GX1X1/2 BEV (NEEDLE) IMPLANT
NEEDLE HYPO 25GX1X1/2 BEV (NEEDLE) ×1 IMPLANT
NS IRRIG 1000ML POUR BTL (IV SOLUTION) ×2 IMPLANT
PACK ORTHO EXTREMITY (CUSTOM PROCEDURE TRAY) ×2 IMPLANT
PAD ARMBOARD 7.5X6 YLW CONV (MISCELLANEOUS) ×4 IMPLANT
PAD CAST 3X4 CTTN HI CHSV (CAST SUPPLIES) IMPLANT
PAD CAST 4YDX4 CTTN HI CHSV (CAST SUPPLIES) IMPLANT
PADDING CAST COTTON 3X4 STRL (CAST SUPPLIES) ×1
PADDING CAST COTTON 4X4 STRL (CAST SUPPLIES) ×1
SET HNDPC FAN SPRY TIP SCT (DISPOSABLE) IMPLANT
SLING ARM FOAM STRAP LRG (SOFTGOODS) IMPLANT
SPLINT PLASTER CAST XFAST 3X15 (CAST SUPPLIES) IMPLANT
SPONGE T-LAP 18X18 ~~LOC~~+RFID (SPONGE) ×2 IMPLANT
SPONGE T-LAP 4X18 ~~LOC~~+RFID (SPONGE) ×2 IMPLANT
SWAB CULTURE ESWAB REG 1ML (MISCELLANEOUS) IMPLANT
SYR 20ML ECCENTRIC (SYRINGE) IMPLANT
SYR CONTROL 10ML LL (SYRINGE) IMPLANT
TOWEL GREEN STERILE (TOWEL DISPOSABLE) ×2 IMPLANT
TOWEL GREEN STERILE FF (TOWEL DISPOSABLE) ×2 IMPLANT
TUBE CONNECTING 12X1/4 (SUCTIONS) ×2 IMPLANT
TUBE FEEDING ENTERAL 5FR 16IN (TUBING) IMPLANT
UNDERPAD 30X36 HEAVY ABSORB (UNDERPADS AND DIAPERS) ×2 IMPLANT
WATER STERILE IRR 1000ML POUR (IV SOLUTION) ×2 IMPLANT
YANKAUER SUCT BULB TIP NO VENT (SUCTIONS) ×2 IMPLANT

## 2022-01-15 NOTE — Anesthesia Preprocedure Evaluation (Addendum)
Anesthesia Evaluation  Patient identified by MRN, date of birth, ID band Patient awake    Reviewed: Allergy & Precautions, NPO status , Patient's Chart, lab work & pertinent test results  Airway Mallampati: I  TM Distance: >3 FB Neck ROM: Full    Dental  (+) Poor Dentition, Missing, Chipped   Pulmonary neg pulmonary ROS    + decreased breath sounds      Cardiovascular negative cardio ROS  Rhythm:Regular Rate:Normal     Neuro/Psych negative neurological ROS  negative psych ROS   GI/Hepatic negative GI ROS,,,(+) Hepatitis -, C  Endo/Other  negative endocrine ROS    Renal/GU negative Renal ROS     Musculoskeletal  (+) Arthritis ,    Abdominal   Peds  Hematology negative hematology ROS (+)   Anesthesia Other Findings   Reproductive/Obstetrics                             Anesthesia Physical Anesthesia Plan  ASA: 3 and emergent  Anesthesia Plan: General   Post-op Pain Management: Dilaudid IV   Induction: Intravenous  PONV Risk Score and Plan: 4 or greater and Ondansetron, Dexamethasone, Midazolam, Scopolamine patch - Pre-op and Treatment may vary due to age or medical condition  Airway Management Planned: Oral ETT  Additional Equipment: None  Intra-op Plan:   Post-operative Plan: Extubation in OR  Informed Consent: I have reviewed the patients History and Physical, chart, labs and discussed the procedure including the risks, benefits and alternatives for the proposed anesthesia with the patient or authorized representative who has indicated his/her understanding and acceptance.     Dental advisory given  Plan Discussed with: CRNA  Anesthesia Plan Comments:        Anesthesia Quick Evaluation
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
## 2022-01-15 NOTE — ED Provider Notes (Addendum)
[facility] EMERGENCY DEPARTMENT Provider Note   CSN: [id number] Arrival date & time: 01/15/22  1719     History  Chief Complaint  Patient presents with   Hand Problem    Deborah Long is a 61 y.o. female.  HPI Patient reports that 2 days ago she was picking up leaves in her yard with her bare hands.  She did not notice any injury and she went inside and saw that she was bleeding.  Patient cleaned the wound and has been trying to keep it clean with peroxide and ice packs and antibiotic ointment.  She reports since however her right index finger has become extremely swollen and red.  She reports she cannot bend it now.  She is also gotten swelling in the palm and back of the hand.  She reports is quite painful.  She is never had similar type injury.  She reports she has not had any nausea vomiting or general weakness.  She reports she did feel like she had a low-grade fever yesterday.    Home Medications Prior to Admission medications   Medication Sig Start Date End Date Taking? Authorizing Provider  predniSONE (STERAPRED UNI-PAK 21 TAB) 10 MG (21) TBPK tablet Take 6 tabs (60mg ) day 1, 5 tabs (50mg ) day 2, 4 tabs (40mg ) day 3, 3 tabs (30mg ) day 4, 2 tabs (20mg ) day 5, and 1 tab (10mg ) day 6. 01/11/20   Joy, Shawn C, PA-C  sertraline (ZOLOFT) 50 MG tablet Take 50 mg by mouth daily.    [provider]      Allergies    Patient has no known allergies.    Review of Systems   Review of Systems  Physical Exam Updated Vital Signs BP (!) 192/103   Pulse 90   Temp 99.4 F (37.4 C) (Oral)   Resp 18   Ht 5' (1.524 m)   Wt 56.7 kg   SpO2 97%   BMI 24.41 kg/m  Physical Exam Constitutional:      Appearance: Normal appearance.  HENT:     Mouth/Throat:     Pharynx: Oropharynx is clear.  Eyes:     Extraocular Movements: Extraocular movements intact.  Cardiovascular:     Rate and Rhythm: Normal rate.  Pulmonary:     Effort: Pulmonary effort is normal.      Breath sounds: Normal breath sounds.  Musculoskeletal:     Comments: Patient has large swelling of her right index finger.  She cannot flex at the PIP or DIP joint.  Erythema extends onto the palmar surface of the hand and dorsum of the hand.  He has several shallow cuts that have eschar present.  No purulent drainage.  See attached images.  Skin:    General: Skin is warm and dry.  Neurological:     General: No focal deficit present.     Mental Status: She is alert and oriented to person, place, and time.     Coordination: Coordination normal.         ED Results / Procedures / Treatments   Labs (all labs ordered are listed, but only abnormal results are displayed) Labs Reviewed  CBC WITH DIFFERENTIAL/PLATELET - Abnormal; Notable for the following components:      Result Value   WBC 13.7 (*)    RBC 5.31 (*)    Hemoglobin 15.5 (*)    Neutro Abs 9.8 (*)    All other components within normal limits  CULTURE, BLOOD (ROUTINE X 2)  CULTURE,  BLOOD (ROUTINE X 2)  BASIC METABOLIC PANEL  LACTIC ACID, PLASMA  SEDIMENTATION RATE    EKG None  Radiology DG Hand Complete Right  Result Date: 01/15/2022 CLINICAL DATA:  Trauma EXAM: RIGHT HAND - COMPLETE 3 VIEW COMPARISON:  None Available. FINDINGS: No evidence of fracture or dislocation. No evidence of arthropathy or other focal bone abnormality. Soft tissue swelling of the index finger. IMPRESSION: 1. No acute osseous abnormality. 2. Soft tissue swelling of the index finger. Electronically Signed   By: Allegra Lai M.D.   On: 01/15/2022 18:01    Procedures Procedures   CRITICAL CARE Performed by: Arby Barrette   Total critical care time: 30 minutes  Critical care time was exclusive of separately billable procedures and treating other patients.  Critical care was necessary to treat or prevent imminent or life-threatening deterioration.  Critical care was time spent personally by me on the following activities: development  of treatment plan with patient and/or surrogate as well as nursing, discussions with consultants, evaluation of patient's response to treatment, examination of patient, obtaining history from patient or surrogate, ordering and performing treatments and interventions, ordering and review of laboratory studies, ordering and review of radiographic studies, pulse oximetry and re-evaluation of patient's condition.  Medications Ordered in ED Medications  vancomycin (VANCOCIN) IVPB 1000 mg/200 mL premix (1,000 mg Intravenous New Bag/Given 01/15/22 2112)  HYDROmorphone (DILAUDID) injection 0.5 mg (0.5 mg Intravenous Given 01/15/22 2151)  vancomycin (VANCOREADY) IVPB 750 mg/150 mL (has no administration in time range)  lactated ringers bolus 1,000 mL (1,000 mLs Intravenous New Bag/Given 01/15/22 2108)  cefTRIAXone (ROCEPHIN) 2 g in sodium chloride 0.9 % 100 mL IVPB (0 g Intravenous Stopped 01/15/22 2107)    ED Course/ Medical Decision Making/ A&P                           Medical Decision Making Amount and/or Complexity of Data Reviewed Labs: ordered. Radiology: ordered.  Risk Prescription drug management. Decision regarding hospitalization.   Patient presents as outlined.  She had minor trauma to her index finger out side and now has large swelling and stiffness of the finger consistent with tenosynovitis and cellulitis to the palmar and dorsal surface of the hand.  Will initiate labs, antibiotics with Rocephin and vancomycin.  Plan for consultation to hand surgery.  White count 13.7.  Plain film x-ray reviewed by radiology and visually reviewed by myself no acute findings.  Patient is clinically well in appearance.  Mental status is clear.  Chart has documentation of history of alcohol abuse and drug use.  Patient reports that she last used drugs and alcohol in her mid 20s.  Reports that she does still smoke marijuana but unequivocally does not drink alcohol and has not used IV drugs in over 20  years.  Consult: Reviewed Dr. Merlyn Lot.  Requested patient be n.p.o. and he will plan to manage operatively this evening.  Lan to transfer to [facility] for operative management of tenosynovitis.  Patient sister and brother have arrived at bedside and have been updated on the plan.          Final Clinical Impression(s) / ED Diagnoses Final diagnoses:  Tenosynovitis    Rx / DC Orders ED Discharge Orders     None         Arby Barrette, MD 01/15/22 9741    Arby Barrette, MD 01/15/22 2213

## 2022-01-15 NOTE — ED Triage Notes (Addendum)
Pt reports on Tuesday she was picking up leaves with bare hands and states she noticed bleeding coming from her RT pointer finger when she got into the house. Pt now has swelling to RT knuckle and RT pointer finger. Reports subjective low-grade fever on Wednesday. No other complaints. Tetanus up-to-date as of last year.

## 2022-01-15 NOTE — ED Notes (Addendum)
Pt awake and alert; lying in bed; GCS 15.  RR even and unlabored on RA with symmetrical rise and fall of chest.  Abdomen soft flat nontender.  R hand erythematous, edematous with ?puncture markings x2 vs scratches to RUE 2nd digit - +R radial pulse, pt denies numbness/tingling - reports only limited flexion and extension to 2nd digit RUE.  Plan for labs/fluids/IV ABX and possible admission.  Will monitor for acute changes and maintain plan of care

## 2022-01-15 NOTE — Progress Notes (Signed)
Pharmacy Antibiotic Note  Deborah Long is a 61 y.o. female admitted on 01/15/2022 with cellulitis.  Pharmacy has been consulted for vancomycin dosing.  Plan: Give IV Vancomycin 1000 x 1 for loading dose, followed by IV Vancomycin 750 every 24 hours for eAUC418. Follow culture data for de-escalation.  Monitor renal function for dose adjustments as indicated.    Height: 5' (152.4 cm) Weight: 56.7 kg (125 lb) IBW/kg (Calculated) : 45.5  Temp (24hrs), Avg:99.9 F (37.7 C), Min:99.9 F (37.7 C), Max:99.9 F (37.7 C)  Recent Labs  Lab 01/15/22 2020  WBC 13.7*  CREATININE 0.88  LATICACIDVEN 0.9    Estimated Creatinine Clearance: 52.3 mL/min (by C-G formula based on SCr of 0.88 mg/dL).    No Known Allergies  Thank you for allowing pharmacy to be a part of this patient's care.  Estill Batten, PharmD, BCCCP  01/15/2022 9:20 PM

## 2022-01-16 ENCOUNTER — Emergency Department (EMERGENCY_DEPARTMENT_HOSPITAL): Payer: Medicare Other | Admitting: Anesthesiology

## 2022-01-16 ENCOUNTER — Encounter (HOSPITAL_COMMUNITY): Payer: Self-pay | Admitting: Orthopedic Surgery

## 2022-01-16 ENCOUNTER — Emergency Department (HOSPITAL_COMMUNITY): Payer: Medicare Other | Admitting: Anesthesiology

## 2022-01-16 ENCOUNTER — Other Ambulatory Visit: Payer: Self-pay

## 2022-01-16 DIAGNOSIS — M1990 Unspecified osteoarthritis, unspecified site: Secondary | ICD-10-CM | POA: Diagnosis not present

## 2022-01-16 DIAGNOSIS — M65141 Other infective (teno)synovitis, right hand: Secondary | ICD-10-CM

## 2022-01-16 MED ORDER — EPHEDRINE SULFATE-NACL 50-0.9 MG/10ML-% IV SOSY
PREFILLED_SYRINGE | INTRAVENOUS | Status: DC | PRN
  Administered 2022-01-16 (×2): 10 mg via INTRAVENOUS

## 2022-01-16 MED ORDER — FENTANYL CITRATE (PF) 250 MCG/5ML IJ SOLN
INTRAMUSCULAR | Status: DC | PRN
  Administered 2022-01-16: 100 ug via INTRAVENOUS
  Administered 2022-01-16: 50 ug via INTRAVENOUS

## 2022-01-16 MED ORDER — ACETAMINOPHEN 10 MG/ML IV SOLN: 1000.0000 mg | Freq: Once | INTRAVENOUS | Status: DC | PRN

## 2022-01-16 MED ORDER — PROMETHAZINE HCL 25 MG/ML IJ SOLN: 6.2500 mg | INTRAMUSCULAR | Status: DC | PRN

## 2022-01-16 MED ORDER — LACTATED RINGERS IV SOLN: INTRAVENOUS | Status: DC

## 2022-01-16 MED ORDER — SODIUM CHLORIDE 0.9 % IR SOLN
Status: DC | PRN
  Administered 2022-01-16: 1000 mL

## 2022-01-16 MED ORDER — BUPIVACAINE HCL (PF) 0.25 % IJ SOLN
INTRAMUSCULAR | Status: AC
  Filled 2022-01-16: qty 30

## 2022-01-16 MED ORDER — AMISULPRIDE (ANTIEMETIC) 5 MG/2ML IV SOLN: 10.0000 mg | Freq: Once | INTRAVENOUS | Status: DC | PRN

## 2022-01-16 MED ORDER — MIDAZOLAM HCL 2 MG/2ML IJ SOLN
INTRAMUSCULAR | Status: DC | PRN
  Administered 2022-01-16: 2 mg via INTRAVENOUS

## 2022-01-16 MED ORDER — PROPOFOL 10 MG/ML IV BOLUS
INTRAVENOUS | Status: DC | PRN
  Administered 2022-01-16: 115 mg via INTRAVENOUS

## 2022-01-16 MED ORDER — LACTATED RINGERS IV SOLN: INTRAVENOUS | Status: DC | PRN

## 2022-01-16 MED ORDER — ACETAMINOPHEN 160 MG/5ML PO SOLN: 325.0000 mg | Freq: Once | ORAL | Status: DC | PRN

## 2022-01-16 MED ORDER — LIDOCAINE 2% (20 MG/ML) 5 ML SYRINGE
INTRAMUSCULAR | Status: DC | PRN
  Administered 2022-01-16: 40 mg via INTRAVENOUS

## 2022-01-16 MED ORDER — ONDANSETRON HCL 4 MG/2ML IJ SOLN
INTRAMUSCULAR | Status: DC | PRN
  Administered 2022-01-16: 4 mg via INTRAVENOUS

## 2022-01-16 MED ORDER — HYDROCODONE-ACETAMINOPHEN 5-325 MG PO TABS: ORAL_TABLET | ORAL | 0 refills | Status: AC

## 2022-01-16 MED ORDER — ACETAMINOPHEN 325 MG PO TABS: 325.0000 mg | ORAL_TABLET | Freq: Once | ORAL | Status: DC | PRN

## 2022-01-16 MED ORDER — SULFAMETHOXAZOLE-TRIMETHOPRIM 800-160 MG PO TABS: 1.0000 | ORAL_TABLET | Freq: Two times a day (BID) | ORAL | 0 refills | Status: AC

## 2022-01-16 MED ORDER — SUCCINYLCHOLINE CHLORIDE 200 MG/10ML IV SOSY
PREFILLED_SYRINGE | INTRAVENOUS | Status: DC | PRN
  Administered 2022-01-16: 140 mg via INTRAVENOUS

## 2022-01-16 MED ORDER — DEXAMETHASONE SODIUM PHOSPHATE 10 MG/ML IJ SOLN
INTRAMUSCULAR | Status: DC | PRN
  Administered 2022-01-16: 10 mg via INTRAVENOUS

## 2022-01-16 MED ORDER — BUPIVACAINE HCL (PF) 0.25 % IJ SOLN
INTRAMUSCULAR | Status: DC | PRN
  Administered 2022-01-16: 11 mL

## 2022-01-16 MED ORDER — HYDROMORPHONE HCL 1 MG/ML IJ SOLN: 0.2500 mg | INTRAMUSCULAR | Status: DC | PRN

## 2022-01-16 NOTE — Op Note (Signed)
NAME: Deborah Long MEDICAL RECORD NO: [id number] DATE OF BIRTH: [birth year] FACILITY: [facility] LOCATION: [facility] OR PHYSICIAN: Tami Ribas, MD   OPERATIVE REPORT   DATE OF PROCEDURE: 01/16/22    PREOPERATIVE DIAGNOSIS: Right index finger flexor sheath infection possible PIP joint infection   POSTOPERATIVE DIAGNOSIS: Right index finger flexor sheath infection possible PIP joint infection   PROCEDURE: 1.  Incision and drainage right index finger flexor tendon sheath 2.  Incision and drainage right index finger PIP joint 3.  Incision and drainage dorsum right hand   SURGEON:  Betha Loa, M.D.   ASSISTANT: none   ANESTHESIA:  General   INTRAVENOUS FLUIDS:  Per anesthesia flow sheet.   ESTIMATED BLOOD LOSS:  Minimal.   COMPLICATIONS:  None.   SPECIMENS: Cultures to micro   TOURNIQUET TIME:    Total Tourniquet Time Documented: Upper Arm (Right) - 34 minutes Total: Upper Arm (Right) - 34 minutes    DISPOSITION:  Stable to PACU.   INDICATIONS: 61 year old female states she sustained lacerations to the right index finger while clearing her yard of sticks 2 days ago.  She has had increasing swelling pain and erythema of the hand since.  No fevers or chills.  She was seen at [facility] where she was felt to have a flexor sheath infection.  She was transferred to [facility] for further care.  Recommended incision and drainage in the operating room.  Risks, benefits and alternatives of surgery were discussed including the risks of blood loss, infection, damage to nerves, vessels, tendons, ligaments, bone for surgery, need for additional surgery, complications with wound healing, continued pain, stiffness, , need for repeat irrigation and debridement.  She voiced understanding of these risks and elected to proceed.  OPERATIVE COURSE:  After being identified preoperatively by myself,  the patient and I agreed on the procedure and site of the procedure.  The surgical site was  marked.  Surgical consent had been signed.  She had been given ceftriaxone and vancomycin at [facility].  She was transferred to the operating room and placed on the operating table in supine position with the Right upper extremity on an arm board.  General anesthesia was induced by the anesthesiologist.  Right upper extremity was prepped and draped in normal sterile orthopedic fashion.  A surgical pause was performed between the surgeons, anesthesia, and operating room staff and all were in agreement as to the patient, procedure, and site of procedure.  Tourniquet at the proximal aspect of the extremity was inflated to 250 mmHg after exsanguination of the arm with an Esmarch bandage.  Incision was made at the volar aspect of the MP joint of the index finger and carried in subcutaneous tissues by spreading technique.  The A1 pulley was opened.  There was fluid within the sheath.  No gross purulence.  An incision was made the distal phalanx volarly.  The sheath was opened here as well.  The traumatic portion of wound was over the middle phalanx more toward the radial side.  This was spread with the scissors.  A #5 pediatric feeding tube was placed into the flexor sheath from proximally.  Cultures have been taken for aerobes and anaerobes from the flexor sheath.  The feeding tube was used to irrigate the flexor sheath.  Good effluent was obtained from the traumatic wound at the middle phalanx as well as the proximal wound.  The feeding tube was then placed into the sheath from distally.  Good effluent was obtained from the traumatic portion of the wound in the middle phalanx and from the distal wound.  The sheath was copiously irrigated.  The wounds on the dorsum of the index finger were connected.  There was edema within the subcutaneous tissues.  The PIP joint was entered underneath the extensor tendon.  There was no purulence within the PIP joint.  The joint was copiously irrigated with sterile saline.   The cavity tracked back over the index finger onto the dorsum of the hand where there was some erythema.  An additional incision was made on the dorsum of the hand.  Again there was edematous fluid but no purulence.  The wound was copiously irrigated with sterile saline.  All wounds were then packed with half inch iodoform gauze.  They were injected with quarter percent plain Marcaine to aid in postoperative analgesia.  They were dressed with sterile 4 x 4's and wrapped with a Kerlix bandage.  Volar splint was placed and wrapped with Kerlix and Ace bandage.  The tourniquet was deflated at 34 minutes.  Fingertips were pink with brisk capillary refill after deflation of tourniquet.  The operative  drapes were broken down.  The patient was awoken from anesthesia safely.  She was transferred back to the stretcher and taken to PACU in stable condition.  I will see her back in the office in 3-4 days for postoperative followup.  I will give her a prescription for Norco 5/325 1-2 tabs PO q6 hours prn pain, dispense # 20 and Bactrim DS 1 p.o. twice daily x 7 days.   Betha Loa, MD Electronically signed, 01/16/22

## 2022-01-16 NOTE — Transfer of Care (Signed)
Immediate Anesthesia Transfer of Care Note  Patient: Deborah Long  Procedure(s) Performed: INCISION AND DRAINAGE RIGHT HAND (Right: Hand)  Patient Location: PACU  Anesthesia Type:General  Level of Consciousness: awake, oriented, and sedated  Airway & Oxygen Therapy: Patient connected to nasal cannula oxygen  Post-op Assessment: Report given to RN and Post -op Vital signs reviewed and stable  Post vital signs: Reviewed and stable  Last Vitals:  Vitals Value Taken Time  BP 150/73 01/16/22 0200  Temp    Pulse 101 01/16/22 0203  Resp 15 01/16/22 0203  SpO2 98 % 01/16/22 0203  Vitals shown include unvalidated device data.  Last Pain:  Vitals:   01/15/22 2237  TempSrc:   PainSc: 7          Complications: No notable events documented.

## 2022-01-16 NOTE — Discharge Instructions (Addendum)

## 2022-01-16 NOTE — Anesthesia Procedure Notes (Signed)
Procedure Name: Intubation Date/Time: 01/16/2022 12:48 AM  Performed by: Molli Hazard, CRNAPre-anesthesia Checklist: Patient identified, Emergency Drugs available, Suction available and Patient being monitored Patient Re-evaluated:Patient Re-evaluated prior to induction Oxygen Delivery Method: Circle system utilized Preoxygenation: Pre-oxygenation with 100% oxygen Induction Type: IV induction and Rapid sequence Laryngoscope Size: Miller and 2 Grade View: Grade I Tube type: Oral Tube size: 7.5 mm Number of attempts: 1 Airway Equipment and Method: Stylet Placement Confirmation: ETT inserted through vocal cords under direct vision, positive ETCO2 and breath sounds checked- equal and bilateral Secured at: 22 cm Tube secured with: Tape Dental Injury: Teeth and Oropharynx as per pre-operative assessment

## 2022-01-16 NOTE — H&P (Signed)
Deborah Long is an 61 y.o. female.   Chief Complaint: right index infection HPI: 61 yo female states she sustained lacerations to right index finger 2 days ago while cleaning in yard.  Has had progressively worsening swelling and erythema.  No fevers or chills.  Allergies:  Allergies  Allergen Reactions   Aspirin Hives    Past Medical History:  Diagnosis Date   Arthritis    Back pain    H/O ETOH abuse    Hepatitis C    IV drug abuse (HCC)    Liver disease     Past Surgical History:  Procedure Laterality Date   ECTOPIC PREGNANCY SURGERY      Family History: History reviewed. No pertinent family history.  Social History:   reports that she has never smoked. She has never used smokeless tobacco. She reports that she does not currently use alcohol. She reports that she does not currently use drugs.  Medications: (Not in a hospital admission)   Results for orders placed or performed during the hospital encounter of 01/15/22 (from the past 48 hour(s))  Basic metabolic panel     Status: None   Collection Time: 01/15/22  8:20 PM  Result Value Ref Range   Sodium 137 135 - 145 mmol/L   Potassium 3.5 3.5 - 5.1 mmol/L   Chloride 103 98 - 111 mmol/L   CO2 25 22 - 32 mmol/L   Glucose, Bld 99 70 - 99 mg/dL    Comment: Glucose reference range applies only to samples taken after fasting for at least 8 hours.   BUN 13 8 - 23 mg/dL   Creatinine, Ser 7.82 0.44 - 1.00 mg/dL   Calcium 9.8 8.9 - 95.6 mg/dL   GFR, Estimated >21 >30 mL/min    Comment: (NOTE) Calculated using the CKD-EPI Creatinine Equation (2021)    Anion gap 9 5 - 15    Comment: Performed at [facility], [address] [place], [place] [address]  Lactic acid, plasma     Status: None   Collection Time: 01/15/22  8:20 PM  Result Value Ref Range   Lactic Acid, Venous 0.9 0.5 - 1.9 mmol/L    Comment: Performed at [facility], [address] [place], [place] [address]  CBC with Differential      Status: Abnormal   Collection Time: 01/15/22  8:20 PM  Result Value Ref Range   WBC 13.7 (H) 4.0 - 10.5 K/uL   RBC 5.31 (H) 3.87 - 5.11 MIL/uL   Hemoglobin 15.5 (H) 12.0 - 15.0 g/dL   HCT 95.2 84.1 - 32.4 %   MCV 86.4 80.0 - 100.0 fL   MCH 29.2 26.0 - 34.0 pg   MCHC 33.8 30.0 - 36.0 g/dL   RDW 40.1 02.7 - 25.3 %   Platelets 208 150 - 400 K/uL   nRBC 0.0 0.0 - 0.2 %   Neutrophils Relative % 72 %   Neutro Abs 9.8 (H) 1.7 - 7.7 K/uL   Lymphocytes Relative 24 %   Lymphs Abs 3.2 0.7 - 4.0 K/uL   Monocytes Relative 4 %   Monocytes Absolute 0.6 0.1 - 1.0 K/uL   Eosinophils Relative 0 %   Eosinophils Absolute 0.0 0.0 - 0.5 K/uL   Basophils Relative 0 %   Basophils Absolute 0.0 0.0 - 0.1 K/uL   Immature Granulocytes 0 %   Abs Immature Granulocytes 0.04 0.00 - 0.07 K/uL    Comment: Performed at [facility]  [address] [place], [place] [address]  Sedimentation rate     Status: Abnormal   Collection Time: 01/15/22  8:20 PM  Result Value Ref Range   Sed Rate 25 (H) 0 - 22 mm/hr    Comment: Performed at [facility], [address] [place], [place] [address]  Hepatic function panel     Status: Abnormal   Collection Time: 01/15/22 10:22 PM  Result Value Ref Range   Total Protein 9.2 (H) 6.5 - 8.1 g/dL   Albumin 4.8 3.5 - 5.0 g/dL   AST 19 15 - 41 U/L   ALT 12 0 - 44 U/L   Alkaline Phosphatase 94 38 - 126 U/L   Total Bilirubin 1.0 0.3 - 1.2 mg/dL   Bilirubin, Direct 0.2 0.0 - 0.2 mg/dL   Indirect Bilirubin 0.8 0.3 - 0.9 mg/dL    Comment: Performed at [facility], [address] [place], [place] [address]  Protime-INR     Status: None   Collection Time: 01/15/22 10:22 PM  Result Value Ref Range   Prothrombin Time 14.6 11.4 - 15.2 seconds   INR 1.2 0.8 - 1.2    Comment: (NOTE) INR goal varies based on device and disease states. Performed at [facility], [address] [place], [place] [address]   Ethanol     Status: None    Collection Time: 01/15/22 10:22 PM  Result Value Ref Range   Alcohol, Ethyl (B) <10 <10 mg/dL    Comment: (NOTE) Lowest detectable limit for serum alcohol is 10 mg/dL.  For medical purposes only. Performed at [facility], [address] [place], [place] [address]   Urine rapid drug screen (hosp performed)     Status: Abnormal   Collection Time: 01/15/22 10:30 PM  Result Value Ref Range   Opiates NONE DETECTED NONE DETECTED   Cocaine NONE DETECTED NONE DETECTED   Benzodiazepines NONE DETECTED NONE DETECTED   Amphetamines NONE DETECTED NONE DETECTED   Tetrahydrocannabinol POSITIVE (A) NONE DETECTED   Barbiturates NONE DETECTED NONE DETECTED    Comment: (NOTE) DRUG SCREEN FOR MEDICAL PURPOSES ONLY.  IF CONFIRMATION IS NEEDED FOR ANY PURPOSE, NOTIFY LAB WITHIN 5 DAYS.  LOWEST DETECTABLE LIMITS FOR URINE DRUG SCREEN Drug Class                     Cutoff (ng/mL) Amphetamine and metabolites    1000 Barbiturate and metabolites    200 Benzodiazepine                 200 Opiates and metabolites        300 Cocaine and metabolites        300 THC                            50 Performed at [facility], [address] [place], [place] [address]   Urinalysis, Routine w reflex microscopic     Status: Abnormal   Collection Time: 01/15/22 10:30 PM  Result Value Ref Range   Color, Urine YELLOW YELLOW   APPearance CLEAR CLEAR   Specific Gravity, Urine 1.025 1.005 - 1.030   pH 5.5 5.0 - 8.0   Glucose, UA NEGATIVE NEGATIVE mg/dL   Hgb urine dipstick NEGATIVE NEGATIVE   Bilirubin Urine NEGATIVE NEGATIVE   Ketones, ur 40 (A) NEGATIVE mg/dL  Protein, ur 30 (A) NEGATIVE mg/dL   Nitrite NEGATIVE NEGATIVE   Leukocytes,Ua TRACE (A) NEGATIVE    Comment: Performed at [facility], [address] [place], [place] [address]  Urinalysis, Microscopic (reflex)     Status: Abnormal   Collection Time: 01/15/22 10:30 PM  Result Value Ref Range   RBC / HPF 0-5 0 -  5 RBC/hpf   WBC, UA 0-5 0 - 5 WBC/hpf   Bacteria, UA MANY (A) NONE SEEN   Squamous Epithelial / LPF 6-10 0 - 5   Mucus PRESENT     Comment: Performed at [facility], [address] [place], [place] [address]    DG Hand Complete Right  Result Date: 01/15/2022 CLINICAL DATA:  Trauma EXAM: RIGHT HAND - COMPLETE 3 VIEW COMPARISON:  None Available. FINDINGS: No evidence of fracture or dislocation. No evidence of arthropathy or other focal bone abnormality. Soft tissue swelling of the index finger. IMPRESSION: 1. No acute osseous abnormality. 2. Soft tissue swelling of the index finger. Electronically Signed   By: Allegra Lai M.D.   On: 01/15/2022 18:01      Blood pressure (!) 183/98, pulse 88, temperature 98.7 F (37.1 C), temperature source Oral, resp. rate 18, height 5' (1.524 m), weight 56.7 kg, SpO2 99 %.  General appearance: alert, cooperative, and appears stated age Head: Normocephalic, without obvious abnormality, atraumatic Neck: supple, symmetrical, trachea midline Extremities: Intact sensation and capillary refill all digits.  +epl/fpl/io.  Lacerations both volar and dorsal.  Tender to palpation volarly along flexor sheath.  Erythema on dorsum of finger and hand.  Pain with motion of finger.  Tender at pip joint as well. Pulses: 2+ and symmetric Skin: Skin color, texture, turgor normal. No rashes or lesions Neurologic: Grossly normal Incision/Wound: as above  Assessment/Plan Right index finger flexor sheath infection and dorsal abscess.  Recommend incision and drainage right index finger/hand.  May need drainage of pip joint as well.  Risks, benefits and alternatives of surgery were discussed including risks of blood loss, infection, damage to nerves/vessels/tendons/ligament/bone, failure of surgery, need for additional surgery, complication with wound healing, stiffness, need for repeat irrigation and debridement.  She voiced understanding of these risks and  elected to proceed.    Betha Loa 01/16/2022, 12:29 AM

## 2022-01-17 NOTE — Anesthesia Postprocedure Evaluation (Signed)
Anesthesia Post Note  Patient: Sybella Harnish  Procedure(s) Performed: INCISION AND DRAINAGE RIGHT HAND (Right: Hand)     Patient location during evaluation: PACU Anesthesia Type: General Level of consciousness: awake and alert Pain management: pain level controlled Vital Signs Assessment: post-procedure vital signs reviewed and stable Respiratory status: spontaneous breathing, nonlabored ventilation, respiratory function stable and patient connected to nasal cannula oxygen Cardiovascular status: blood pressure returned to baseline and stable Postop Assessment: no apparent nausea or vomiting Anesthetic complications: no   No notable events documented.  Last Vitals:  Vitals:   01/16/22 0215 01/16/22 0230  BP: (!) 157/69 (!) 150/67  Pulse: 94 95  Resp: 13 12  Temp:  36.7 C  SpO2: 99% 99%    Last Pain:  Vitals:   01/16/22 0230  TempSrc:   PainSc: 0-No pain                 Shelton Silvas

## 2022-01-21 LAB — CULTURE, BLOOD (ROUTINE X 2)
Culture: NO GROWTH
Culture: NO GROWTH
Special Requests: ADEQUATE
Special Requests: ADEQUATE

## 2022-01-21 LAB — AEROBIC/ANAEROBIC CULTURE W GRAM STAIN (SURGICAL/DEEP WOUND): Culture: NO GROWTH

## 2022-05-13 ENCOUNTER — Encounter: Payer: Self-pay | Admitting: *Deleted
